# Patient Record
Sex: Male | Born: 1965
Health system: Southern US, Community
[De-identification: ages and names within clinical notes are randomized; demographics above are authoritative.]

## PROBLEM LIST (undated history)

## (undated) DIAGNOSIS — IMO0002 Reserved for concepts with insufficient information to code with codable children: Secondary | ICD-10-CM

## (undated) DIAGNOSIS — N400 Enlarged prostate without lower urinary tract symptoms: Secondary | ICD-10-CM

## (undated) DIAGNOSIS — R39198 Other difficulties with micturition: Secondary | ICD-10-CM

## (undated) DIAGNOSIS — Z860100 Personal history of colon polyps, unspecified: Secondary | ICD-10-CM

## (undated) DIAGNOSIS — J189 Pneumonia, unspecified organism: Secondary | ICD-10-CM

## (undated) DIAGNOSIS — Z8669 Personal history of other diseases of the nervous system and sense organs: Secondary | ICD-10-CM

## (undated) DIAGNOSIS — M069 Rheumatoid arthritis, unspecified: Secondary | ICD-10-CM

## (undated) DIAGNOSIS — G8929 Other chronic pain: Secondary | ICD-10-CM

## (undated) DIAGNOSIS — M62838 Other muscle spasm: Secondary | ICD-10-CM

## (undated) DIAGNOSIS — Z8709 Personal history of other diseases of the respiratory system: Secondary | ICD-10-CM

## (undated) DIAGNOSIS — F4310 Post-traumatic stress disorder, unspecified: Secondary | ICD-10-CM

## (undated) DIAGNOSIS — IMO0001 Reserved for inherently not codable concepts without codable children: Secondary | ICD-10-CM

## (undated) DIAGNOSIS — M2540 Effusion, unspecified joint: Secondary | ICD-10-CM

## (undated) DIAGNOSIS — F322 Major depressive disorder, single episode, severe without psychotic features: Secondary | ICD-10-CM

## (undated) DIAGNOSIS — E785 Hyperlipidemia, unspecified: Secondary | ICD-10-CM

## (undated) DIAGNOSIS — M1990 Unspecified osteoarthritis, unspecified site: Secondary | ICD-10-CM

## (undated) DIAGNOSIS — R569 Unspecified convulsions: Secondary | ICD-10-CM

## (undated) DIAGNOSIS — F419 Anxiety disorder, unspecified: Secondary | ICD-10-CM

## (undated) DIAGNOSIS — M549 Dorsalgia, unspecified: Secondary | ICD-10-CM

## (undated) DIAGNOSIS — R42 Dizziness and giddiness: Secondary | ICD-10-CM

## (undated) DIAGNOSIS — M2550 Pain in unspecified joint: Secondary | ICD-10-CM

## (undated) HISTORY — PX: COLONOSCOPY: SHX174

## (undated) HISTORY — PX: MULTIPLE TOOTH EXTRACTIONS: SHX2053

## (undated) HISTORY — PX: TONSILLECTOMY: SUR1361

---

## 2002-01-02 ENCOUNTER — Emergency Department (HOSPITAL_COMMUNITY): Admission: EM | Admit: 2002-01-02 | Discharge: 2002-01-03 | Payer: Self-pay | Admitting: Emergency Medicine

## 2002-01-03 ENCOUNTER — Encounter: Payer: Self-pay | Admitting: Emergency Medicine

## 2002-01-13 ENCOUNTER — Encounter: Payer: Self-pay | Admitting: Emergency Medicine

## 2002-01-13 ENCOUNTER — Emergency Department (HOSPITAL_COMMUNITY): Admission: EM | Admit: 2002-01-13 | Discharge: 2002-01-14 | Payer: Self-pay | Admitting: *Deleted

## 2002-04-09 ENCOUNTER — Emergency Department (HOSPITAL_COMMUNITY): Admission: EM | Admit: 2002-04-09 | Discharge: 2002-04-09 | Payer: Self-pay | Admitting: Internal Medicine

## 2002-09-20 ENCOUNTER — Emergency Department (HOSPITAL_COMMUNITY): Admission: EM | Admit: 2002-09-20 | Discharge: 2002-09-20 | Payer: Self-pay | Admitting: Emergency Medicine

## 2002-09-20 ENCOUNTER — Encounter: Payer: Self-pay | Admitting: Emergency Medicine

## 2003-02-01 ENCOUNTER — Emergency Department (HOSPITAL_COMMUNITY): Admission: EM | Admit: 2003-02-01 | Discharge: 2003-02-01 | Payer: Self-pay | Admitting: Emergency Medicine

## 2003-04-13 ENCOUNTER — Emergency Department (HOSPITAL_COMMUNITY): Admission: EM | Admit: 2003-04-13 | Discharge: 2003-04-13 | Payer: Self-pay | Admitting: *Deleted

## 2003-06-28 ENCOUNTER — Emergency Department (HOSPITAL_COMMUNITY): Admission: EM | Admit: 2003-06-28 | Discharge: 2003-06-28 | Payer: Self-pay | Admitting: Emergency Medicine

## 2003-06-28 ENCOUNTER — Encounter: Payer: Self-pay | Admitting: Emergency Medicine

## 2003-08-03 ENCOUNTER — Emergency Department (HOSPITAL_COMMUNITY): Admission: EM | Admit: 2003-08-03 | Discharge: 2003-08-03 | Payer: Self-pay | Admitting: Emergency Medicine

## 2003-08-03 ENCOUNTER — Encounter: Payer: Self-pay | Admitting: Emergency Medicine

## 2003-09-18 ENCOUNTER — Emergency Department (HOSPITAL_COMMUNITY): Admission: EM | Admit: 2003-09-18 | Discharge: 2003-09-18 | Payer: Self-pay | Admitting: Internal Medicine

## 2003-10-19 ENCOUNTER — Emergency Department (HOSPITAL_COMMUNITY): Admission: EM | Admit: 2003-10-19 | Discharge: 2003-10-19 | Payer: Self-pay | Admitting: Emergency Medicine

## 2004-12-24 ENCOUNTER — Emergency Department (HOSPITAL_COMMUNITY): Admission: EM | Admit: 2004-12-24 | Discharge: 2004-12-24 | Payer: Self-pay | Admitting: Emergency Medicine

## 2006-12-06 ENCOUNTER — Emergency Department (HOSPITAL_COMMUNITY): Admission: EM | Admit: 2006-12-06 | Discharge: 2006-12-06 | Payer: Self-pay | Admitting: Emergency Medicine

## 2008-07-04 ENCOUNTER — Emergency Department (HOSPITAL_COMMUNITY): Admission: EM | Admit: 2008-07-04 | Discharge: 2008-07-04 | Payer: Self-pay | Admitting: Emergency Medicine

## 2009-07-19 ENCOUNTER — Emergency Department (HOSPITAL_COMMUNITY): Admission: EM | Admit: 2009-07-19 | Discharge: 2009-07-19 | Payer: Self-pay | Admitting: Emergency Medicine

## 2010-01-17 ENCOUNTER — Emergency Department (HOSPITAL_COMMUNITY): Admission: EM | Admit: 2010-01-17 | Discharge: 2010-01-17 | Payer: Self-pay | Admitting: Emergency Medicine

## 2010-02-21 ENCOUNTER — Ambulatory Visit (HOSPITAL_COMMUNITY)
Admission: RE | Admit: 2010-02-21 | Discharge: 2010-02-21 | Payer: Self-pay | Source: Home / Self Care | Admitting: Physical Medicine and Rehabilitation

## 2010-11-12 ENCOUNTER — Emergency Department (HOSPITAL_COMMUNITY)
Admission: EM | Admit: 2010-11-12 | Discharge: 2010-11-12 | Payer: Self-pay | Source: Home / Self Care | Admitting: Emergency Medicine

## 2010-11-12 LAB — DIFFERENTIAL
Basophils Absolute: 0 10*3/uL (ref 0.0–0.1)
Basophils Relative: 0 % (ref 0–1)
Eosinophils Absolute: 0.1 10*3/uL (ref 0.0–0.7)
Eosinophils Relative: 2 % (ref 0–5)
Lymphocytes Relative: 30 % (ref 12–46)
Lymphs Abs: 1.4 10*3/uL (ref 0.7–4.0)
Monocytes Absolute: 0.4 10*3/uL (ref 0.1–1.0)
Monocytes Relative: 8 % (ref 3–12)
Neutro Abs: 2.9 10*3/uL (ref 1.7–7.7)
Neutrophils Relative %: 60 % (ref 43–77)

## 2010-11-12 LAB — BASIC METABOLIC PANEL
BUN: 6 mg/dL (ref 6–23)
CO2: 26 mEq/L (ref 19–32)
Calcium: 8.7 mg/dL (ref 8.4–10.5)
Chloride: 102 mEq/L (ref 96–112)
Creatinine, Ser: 0.73 mg/dL (ref 0.4–1.5)
GFR calc Af Amer: >60 mL/min (ref >60)
GFR calc non Af Amer: >60 mL/min (ref >60)
Glucose, Bld: 104 mg/dL — ABNORMAL HIGH (ref 70–99)
Potassium: 3.7 mEq/L (ref 3.5–5.1)
Sodium: 136 mEq/L (ref 135–145)

## 2010-11-12 LAB — CBC
HCT: 48.6 % (ref 39.0–52.0)
Hemoglobin: 17.5 g/dL — ABNORMAL HIGH (ref 13.0–17.0)
MCH: 32.7 pg (ref 26.0–34.0)
MCHC: 36 g/dL (ref 30.0–36.0)
MCV: 90.8 fL (ref 78.0–100.0)
Platelets: 137 10*3/uL — ABNORMAL LOW (ref 150–400)
RBC: 5.35 MIL/uL (ref 4.22–5.81)
RDW: 14.1 % (ref 11.5–15.5)
WBC: 4.8 10*3/uL (ref 4.0–10.5)

## 2010-11-12 LAB — PHENYTOIN LEVEL, TOTAL: Phenytoin Lvl: 14.6 ug/mL (ref 10.0–20.0)

## 2010-11-13 LAB — GLUCOSE, CAPILLARY: Glucose-Capillary: 104 mg/dL — ABNORMAL HIGH (ref 70–99)

## 2011-01-26 ENCOUNTER — Emergency Department (HOSPITAL_COMMUNITY): Payer: PRIVATE HEALTH INSURANCE

## 2011-01-26 ENCOUNTER — Emergency Department (HOSPITAL_COMMUNITY)
Admission: EM | Admit: 2011-01-26 | Discharge: 2011-01-26 | Disposition: A | Discharge status: Home / Self Care | Payer: PRIVATE HEALTH INSURANCE | Attending: Emergency Medicine | Admitting: Emergency Medicine

## 2011-01-26 DIAGNOSIS — R4182 Altered mental status, unspecified: Secondary | ICD-10-CM | POA: Insufficient documentation

## 2011-01-26 DIAGNOSIS — F4310 Post-traumatic stress disorder, unspecified: Secondary | ICD-10-CM | POA: Insufficient documentation

## 2011-01-26 DIAGNOSIS — R569 Unspecified convulsions: Secondary | ICD-10-CM | POA: Insufficient documentation

## 2011-01-26 LAB — CBC
HCT: 50 % (ref 39.0–52.0)
Hemoglobin: 17.4 g/dL — ABNORMAL HIGH (ref 13.0–17.0)
MCH: 32.8 pg (ref 26.0–34.0)
MCHC: 34.8 g/dL (ref 30.0–36.0)
MCV: 94.2 fL (ref 78.0–100.0)
Platelets: 130 10*3/uL — ABNORMAL LOW (ref 150–400)
RBC: 5.31 MIL/uL (ref 4.22–5.81)
RDW: 14.6 % (ref 11.5–15.5)
WBC: 4.3 10*3/uL (ref 4.0–10.5)

## 2011-01-26 LAB — DIFFERENTIAL
Basophils Absolute: 0 10*3/uL (ref 0.0–0.1)
Basophils Relative: 0 % (ref 0–1)
Eosinophils Absolute: 0 10*3/uL (ref 0.0–0.7)
Eosinophils Relative: 1 % (ref 0–5)
Lymphocytes Relative: 20 % (ref 12–46)
Lymphs Abs: 0.9 10*3/uL (ref 0.7–4.0)
Monocytes Absolute: 0.4 10*3/uL (ref 0.1–1.0)
Monocytes Relative: 9 % (ref 3–12)
Neutro Abs: 3.1 10*3/uL (ref 1.7–7.7)
Neutrophils Relative %: 71 % (ref 43–77)

## 2011-01-26 LAB — COMPREHENSIVE METABOLIC PANEL
ALT: 16 U/L (ref 0–53)
AST: 18 U/L (ref 0–37)
Albumin: 4.1 g/dL (ref 3.5–5.2)
Alkaline Phosphatase: 139 U/L — ABNORMAL HIGH (ref 39–117)
BUN: 5 mg/dL — ABNORMAL LOW (ref 6–23)
CO2: 25 mEq/L (ref 19–32)
Calcium: 9 mg/dL (ref 8.4–10.5)
Chloride: 103 mEq/L (ref 96–112)
Creatinine, Ser: 0.8 mg/dL (ref 0.4–1.5)
GFR calc Af Amer: >60 mL/min (ref >60)
GFR calc non Af Amer: >60 mL/min (ref >60)
Glucose, Bld: 95 mg/dL (ref 70–99)
Potassium: 3.5 mEq/L (ref 3.5–5.1)
Sodium: 137 mEq/L (ref 135–145)
Total Bilirubin: 0.7 mg/dL (ref 0.3–1.2)
Total Protein: 6.8 g/dL (ref 6.0–8.3)

## 2011-01-26 LAB — POCT CARDIAC MARKERS
CKMB, poc: <1 ng/mL — ABNORMAL LOW (ref 1.0–8.0)
Myoglobin, poc: 91.1 ng/mL (ref 12–200)
Troponin i, poc: <0.05 ng/mL (ref 0.00–0.09)

## 2011-01-26 LAB — GLUCOSE, CAPILLARY: Glucose-Capillary: 167 mg/dL — ABNORMAL HIGH (ref 70–99)

## 2011-01-26 LAB — RAPID URINE DRUG SCREEN, HOSP PERFORMED
Amphetamines: NOT DETECTED
Barbiturates: NOT DETECTED
Benzodiazepines: POSITIVE — AB
Cocaine: NOT DETECTED
Opiates: NOT DETECTED
Tetrahydrocannabinol: POSITIVE — AB

## 2011-01-26 LAB — ETHANOL: Alcohol, Ethyl (B): <5 mg/dL (ref 0–10)

## 2011-01-27 LAB — GLUCOSE, CAPILLARY: Glucose-Capillary: 144 mg/dL — ABNORMAL HIGH (ref 70–99)

## 2011-02-13 LAB — DIFFERENTIAL
Basophils Absolute: 0 K/uL (ref 0.0–0.1)
Basophils Relative: 1 % (ref 0–1)
Eosinophils Absolute: 0.1 K/uL (ref 0.0–0.7)
Eosinophils Relative: 2 % (ref 0–5)
Lymphocytes Relative: 28 % (ref 12–46)
Lymphs Abs: 1.8 K/uL (ref 0.7–4.0)
Monocytes Absolute: 0.5 K/uL (ref 0.1–1.0)
Monocytes Relative: 9 % (ref 3–12)
Neutro Abs: 3.9 K/uL (ref 1.7–7.7)
Neutrophils Relative %: 61 % (ref 43–77)

## 2011-02-13 LAB — CBC
HCT: 48.3 % (ref 39.0–52.0)
Hemoglobin: 17 g/dL (ref 13.0–17.0)
MCHC: 35.1 g/dL (ref 30.0–36.0)
MCV: 92.1 fL (ref 78.0–100.0)
Platelets: 198 K/uL (ref 150–400)
RBC: 5.24 MIL/uL (ref 4.22–5.81)
RDW: 13.7 % (ref 11.5–15.5)
WBC: 6.4 K/uL (ref 4.0–10.5)

## 2011-02-13 LAB — BASIC METABOLIC PANEL WITH GFR
BUN: 6 mg/dL (ref 6–23)
CO2: 34 meq/L — ABNORMAL HIGH (ref 19–32)
Calcium: 9.3 mg/dL (ref 8.4–10.5)
Chloride: 106 meq/L (ref 96–112)
Creatinine, Ser: 1.04 mg/dL (ref 0.4–1.5)
GFR calc non Af Amer: >60 mL/min
Glucose, Bld: 85 mg/dL (ref 70–99)
Potassium: 4.7 meq/L (ref 3.5–5.1)
Sodium: 142 meq/L (ref 135–145)

## 2011-06-23 ENCOUNTER — Other Ambulatory Visit: Payer: Self-pay

## 2011-06-23 ENCOUNTER — Emergency Department (HOSPITAL_COMMUNITY)
Admission: EM | Admit: 2011-06-23 | Discharge: 2011-06-24 | Disposition: A | Discharge status: Other Acute Inpatient Hospital | Payer: PRIVATE HEALTH INSURANCE | Attending: Emergency Medicine | Admitting: Emergency Medicine

## 2011-06-23 ENCOUNTER — Encounter: Payer: Self-pay | Admitting: Emergency Medicine

## 2011-06-23 DIAGNOSIS — F17200 Nicotine dependence, unspecified, uncomplicated: Secondary | ICD-10-CM | POA: Insufficient documentation

## 2011-06-23 DIAGNOSIS — F29 Unspecified psychosis not due to a substance or known physiological condition: Secondary | ICD-10-CM | POA: Insufficient documentation

## 2011-06-23 DIAGNOSIS — R569 Unspecified convulsions: Secondary | ICD-10-CM | POA: Insufficient documentation

## 2011-06-23 HISTORY — DX: Unspecified convulsions: R56.9

## 2011-06-23 LAB — RAPID URINE DRUG SCREEN, HOSP PERFORMED
Amphetamines: NOT DETECTED
Benzodiazepines: POSITIVE — AB
Tetrahydrocannabinol: POSITIVE — AB

## 2011-06-23 LAB — BASIC METABOLIC PANEL
CO2: 23 mEq/L (ref 19–32)
Calcium: 10.3 mg/dL (ref 8.4–10.5)
Chloride: 99 mEq/L (ref 96–112)
Creatinine, Ser: 0.88 mg/dL (ref 0.50–1.35)
Glucose, Bld: 93 mg/dL (ref 70–99)

## 2011-06-23 LAB — CBC
HCT: 52 % (ref 39.0–52.0)
Hemoglobin: 18.1 g/dL — ABNORMAL HIGH (ref 13.0–17.0)
MCV: 92.4 fL (ref 78.0–100.0)
RBC: 5.63 MIL/uL (ref 4.22–5.81)
WBC: 17 10*3/uL — ABNORMAL HIGH (ref 4.0–10.5)

## 2011-06-23 LAB — DIFFERENTIAL
Eosinophils Relative: 0 % (ref 0–5)
Lymphocytes Relative: 7 % — ABNORMAL LOW (ref 12–46)
Lymphs Abs: 1.2 10*3/uL (ref 0.7–4.0)
Monocytes Absolute: 1.4 10*3/uL — ABNORMAL HIGH (ref 0.1–1.0)
Monocytes Relative: 8 % (ref 3–12)
Neutro Abs: 14.4 10*3/uL — ABNORMAL HIGH (ref 1.7–7.7)

## 2011-06-23 MED ORDER — ACETAMINOPHEN 325 MG PO TABS: 650.0000 mg | ORAL_TABLET | ORAL | Status: DC | PRN

## 2011-06-23 MED ORDER — IBUPROFEN 400 MG PO TABS: 600.0000 mg | ORAL_TABLET | Freq: Three times a day (TID) | ORAL | Status: DC | PRN

## 2011-06-23 MED ORDER — ONDANSETRON HCL 4 MG PO TABS: 4.0000 mg | ORAL_TABLET | Freq: Three times a day (TID) | ORAL | Status: DC | PRN

## 2011-06-23 MED ORDER — ALUM & MAG HYDROXIDE-SIMETH 200-200-20 MG/5ML PO SUSP: 30.0000 mL | ORAL | Status: DC | PRN

## 2011-06-23 MED ORDER — SODIUM CHLORIDE 0.9 % IV BOLUS (SEPSIS)
1000.0000 mL | Freq: Once | INTRAVENOUS | Status: AC
  Administered 2011-06-23: 1000 mL via INTRAVENOUS

## 2011-06-23 MED ORDER — NICOTINE 21 MG/24HR TD PT24: 21.0000 mg | MEDICATED_PATCH | Freq: Every day | TRANSDERMAL | Status: DC

## 2011-06-23 MED ORDER — ZOLPIDEM TARTRATE 5 MG PO TABS: 10.0000 mg | ORAL_TABLET | Freq: Every evening | ORAL | Status: DC | PRN

## 2011-06-23 MED ORDER — LORAZEPAM 1 MG PO TABS: 2.0000 mg | ORAL_TABLET | ORAL | Status: DC | PRN

## 2011-06-23 NOTE — ED Notes (Signed)
Pt alert and oriented, although difficult to redirect at times.  Reports that he was mowing his yard, and then remembers being told he had a seizure.  Pt does admit to not taking his medications for the last few days.  Currently, no distress or seizure activity noted.  Saline lock in place.  Access flushed, site benign.  EDP at bedside.

## 2011-06-23 NOTE — ED Notes (Signed)
[employer] on unit to serve involuntary commitment papers.  Pt cooperative and understanding of situation.

## 2011-06-23 NOTE — ED Provider Notes (Addendum)
History  Scribed for Dr. Adriana Simas, the patient was seen in room 14. This chart was scribed by Hillery Hunter. This patient's care was started at 7:32PM.   CSN: [id number] Arrival date & time: 06/23/2011  6:55 PM  Chief Complaint  Patient presents with  . Seizures   HPI Jeffrey Wilcox is a 45 y.o. male with a known sz disorder presents to the Emergency Department complaining of a single episode of seizure at home today. The patient reports that he had stopped taking his medications (including Lexapro, Lamictal, Dilantin and a benzodiazepine) 3 or 4 days ago because he, "wanted to start from scratch" - including stopping smoking. He reports that he was outdoors in the sun today for several hours mowing the lawn with a push mower just prior to onset.   The patient was brought in by a home care nurse who did not witness the seizure.  The patient reports that his last seizure may have been "one time within the year".   Additional history provided by sister as reported over the phone to nurse: She reports that patient "took a bunch of medications" and "impaled himself" earlier today.   HPI ELEMENTS:  Onset: today Timing: single episode Modifying factors: stopped taking medications Context: seizure occurred while mowing the lawn    Past Medical History  Diagnosis Date  . Seizures     Past Surgical History  Procedure Date  . Tonsillectomy     No family history on file.  History  Substance Use Topics  . Smoking status: Current Everyday Smoker -- 1.0 packs/day  . Smokeless tobacco: Not on file  . Alcohol Use: No      Review of Systems  Constitutional: Negative for fever.  All other systems reviewed and are negative.    Physical Exam  BP 121/89  Pulse 100  Temp(Src) 97.5 F (36.4 C) (Oral)  Resp 16  Ht 6' (1.829 m)  Wt 190 lb (86.183 kg)  BMI 25.77 kg/m2  SpO2 92%  Physical Exam  Nursing note and vitals reviewed. Constitutional: He is oriented to person,  place, and time. He appears well-developed and well-nourished. No distress.  HENT:  Head: Normocephalic and atraumatic.  Neck: Neck supple.  Cardiovascular: Normal rate, regular rhythm and normal heart sounds.   Pulmonary/Chest: Effort normal and breath sounds normal.  Musculoskeletal: Normal range of motion.  Neurological: He is alert and oriented to person, place, and time.       Able to move arms around  Skin: Skin is warm and dry.       Small spot of dried blood on center abdomen above umbilicus  Psychiatric:       Loose associations Flighty and inappropriate affect    ED Course  Procedures  OTHER DATA REVIEWED: Nursing notes, vital signs, and past medical records reviewed.   DIAGNOSTIC STUDIES: Oxygen Saturation is 92% on room air, adequate by my interpretation.    LABS / RADIOLOGY:  Results for orders placed during the hospital encounter of 06/23/11  CBC      Component Value Range   WBC 17.0 (*) 4.0 - 10.5 (K/uL)   RBC 5.63  4.22 - 5.81 (MIL/uL)   Hemoglobin 18.1 (*) 13.0 - 17.0 (g/dL)   HCT 29.5  28.4 - 13.2 (%)   MCV 92.4  78.0 - 100.0 (fL)   MCH 32.1  26.0 - 34.0 (pg)   MCHC 34.8  30.0 - 36.0 (g/dL)   RDW 44.0  10.2 - 72.5 (%)  Platelets 152  150 - 400 (K/uL)  DIFFERENTIAL      Component Value Range   Neutrophils Relative 85 (*) 43 - 77 (%)   Neutro Abs 14.4 (*) 1.7 - 7.7 (K/uL)   Lymphocytes Relative 7 (*) 12 - 46 (%)   Lymphs Abs 1.2  0.7 - 4.0 (K/uL)   Monocytes Relative 8  3 - 12 (%)   Monocytes Absolute 1.4 (*) 0.1 - 1.0 (K/uL)   Eosinophils Relative 0  0 - 5 (%)   Eosinophils Absolute 0.0  0.0 - 0.7 (K/uL)   Basophils Relative 0  0 - 1 (%)   Basophils Absolute 0.0  0.0 - 0.1 (K/uL)  BASIC METABOLIC PANEL      Component Value Range   Sodium 140  135 - 145 (mEq/L)   Potassium 3.5  3.5 - 5.1 (mEq/L)   Chloride 99  96 - 112 (mEq/L)   CO2 23  19 - 32 (mEq/L)   Glucose, Bld 93  70 - 99 (mg/dL)   BUN 25 (*) 6 - 23 (mg/dL)   Creatinine, Ser 1.61   0.50 - 1.35 (mg/dL)   Calcium 09.6  8.4 - 10.5 (mg/dL)   GFR calc non Af Amer >60  >60 (mL/min)   GFR calc Af Amer >60  >60 (mL/min)  ETHANOL      Component Value Range   Alcohol, Ethyl (B) <11  0 - 11 (mg/dL)  URINE RAPID DRUG SCREEN (HOSP PERFORMED)      Component Value Range   Opiates NONE DETECTED  NONE DETECTED    Cocaine NONE DETECTED  NONE DETECTED    Benzodiazepines POSITIVE (*) NONE DETECTED    Amphetamines NONE DETECTED  NONE DETECTED    Tetrahydrocannabinol POSITIVE (*) NONE DETECTED    Barbiturates NONE DETECTED  NONE DETECTED      ED COURSE / COORDINATION OF CARE: IV NS Fluids were ordered for dehydration Patient was recommended to resume taking prescribed seizure medication, and continue to not smoke   MDM:     Production assistant, radio I personally performed the services described in this documentation, which was scribed in my presence. The recorded information has been reviewed and considered. Donnetta Hutching, MD    Donnetta Hutching, MD 06/23/11 2319  After reviewing the patient's prior emergency department visits, I confirmed that the patient is taking Dilantin for a seizure disorder. His Dilantin level returns at 0.9 which is quite low. I have ordered 3 doses of Dilantin 300 mg 2 hours apart in order to orally load him with Dilantin.  Felisa Bonier, MD 06/24/11 [phone]

## 2011-06-23 NOTE — ED Provider Notes (Addendum)
History     CSN: [id number] Arrival date & time: 06/23/2011  6:55 PM  Chief Complaint  Patient presents with  . Seizures   HPI  Past Medical History  Diagnosis Date  . Seizures     Past Surgical History  Procedure Date  . Tonsillectomy     No family history on file.  History  Substance Use Topics  . Smoking status: Current Everyday Smoker -- 1.0 packs/day  . Smokeless tobacco: Not on file  . Alcohol Use: No      Review of Systems  Physical Exam  BP 121/89  Pulse 100  Temp(Src) 97.5 F (36.4 C) (Oral)  Resp 16  Ht 6' (1.829 m)  Wt 190 lb (86.183 kg)  BMI 25.77 kg/m2  SpO2 92%  Physical Exam  ED Course  Procedures  MDM Patient allegedly had a seizure while mowing grass today. He has not been taking his antiseizure medicines see history of present illness for further history. Additionally during hospital course, the nurse showed me involuntary commitment papers signed by his sister Bradly Chris a Su Hilt. She states patient has a drug problem. His old medications to get money for street drugs. His suicidal and tried to impaling himself today on a sharp object. She states he is a danger to himself. I discussed the situation with the behavioral health consultant, Samson Frederic, and she will evaluate the patient. Patient has had no evidence of seizures in the emergency department. He was lucid but has flight of ideas and tangential thinking.  I personally performed the services described in this documentation, which was scribed in my presence. The recorded information has been reviewed and considered. No att. providers found   Date: 06/23/2011  Rate: 99  Rhythm: normal sinus rhythm  QRS Axis: normal  Intervals: normal  ST/T Wave abnormalities: normal  Conduction Disutrbances:none  Narrative Interpretation:   Old EKG Reviewed: none available Critical care 30 min:  Disc c pt and consultant, commitment papers, eval of seizure and psychiatric illness, unstable Donnetta Hutching,  MD 06/23/11 [id number]  Donnetta Hutching, MD 06/26/11 1347

## 2011-06-23 NOTE — ED Notes (Signed)
Pt alert and oriented very diaphoretic no post dictal behaviors noted. Pt stated he quit tasking his seizure meds 5 days ago by choice witnessed seizure for 2-3 minutes

## 2011-06-24 LAB — PHENYTOIN LEVEL, TOTAL: Phenytoin Lvl: 0.9 ug/mL — ABNORMAL LOW (ref 10.0–20.0)

## 2011-06-24 MED ORDER — LORAZEPAM 2 MG/ML IJ SOLN
2.0000 mg | Freq: Once | INTRAMUSCULAR | Status: AC
  Administered 2011-06-24: 2 mg via INTRAVENOUS
  Filled 2011-06-24: qty 1

## 2011-06-24 MED ORDER — PHENYTOIN SODIUM EXTENDED 100 MG PO CAPS
300.0000 mg | ORAL_CAPSULE | Freq: Once | ORAL | Status: AC
  Administered 2011-06-24: 300 mg via ORAL
  Filled 2011-06-24: qty 3

## 2011-06-24 MED ORDER — LAMOTRIGINE 200 MG PO TABS
200.0000 mg | ORAL_TABLET | Freq: Every day | ORAL | Status: DC
  Filled 2011-06-24: qty 1

## 2011-06-24 MED ORDER — CITALOPRAM HYDROBROMIDE 20 MG PO TABS: 20.0000 mg | ORAL_TABLET | Freq: Every day | ORAL | Status: DC

## 2011-06-24 MED ORDER — CLONAZEPAM 0.5 MG PO TABS: 3.0000 mg | ORAL_TABLET | Freq: Every evening | ORAL | Status: DC | PRN

## 2011-06-24 MED ORDER — ESCITALOPRAM OXALATE 10 MG PO TABS
20.0000 mg | ORAL_TABLET | Freq: Every day | ORAL | Status: DC
  Administered 2011-06-24: 20 mg via ORAL
  Filled 2011-06-24: qty 2

## 2011-06-24 MED ORDER — PHENYTOIN SODIUM EXTENDED 100 MG PO CAPS: 300.0000 mg | ORAL_CAPSULE | Freq: Every day | ORAL | Status: DC

## 2011-06-24 MED ORDER — CLONAZEPAM 0.5 MG PO TABS
1.0000 mg | ORAL_TABLET | Freq: Two times a day (BID) | ORAL | Status: DC | PRN
  Administered 2011-06-24 (×2): 1 mg via ORAL
  Filled 2011-06-24 (×2): qty 2

## 2011-06-24 MED ORDER — PHENYTOIN SODIUM EXTENDED 100 MG PO CAPS: 300.0000 mg | ORAL_CAPSULE | Freq: Three times a day (TID) | ORAL | Status: DC

## 2011-06-24 NOTE — ED Notes (Signed)
[facility] called for transport to [facility].

## 2011-06-24 NOTE — ED Notes (Signed)
Pt given breakfast tray. Ate 100%

## 2011-06-24 NOTE — ED Notes (Signed)
Called [employer] for update on transfer of Pt.  Officer had been diverted to another call, but will be here as soon as possible.  Nurse informed.

## 2011-06-24 NOTE — ED Notes (Signed)
Waiting for pharmacy to restock pyxis to give Dilantin ER 300 mg PO.

## 2011-06-24 NOTE — ED Notes (Signed)
See downtime flow sheet.

## 2011-06-24 NOTE — Progress Notes (Signed)
Pt accepted to [facility] will transfer.

## 2011-09-15 ENCOUNTER — Emergency Department: Admit: 2011-09-15 | Payer: PRIVATE HEALTH INSURANCE

## 2011-09-15 ENCOUNTER — Emergency Department (HOSPITAL_COMMUNITY): Payer: PRIVATE HEALTH INSURANCE

## 2011-09-15 ENCOUNTER — Encounter (HOSPITAL_COMMUNITY): Payer: Self-pay | Admitting: Emergency Medicine

## 2011-09-15 ENCOUNTER — Emergency Department (HOSPITAL_COMMUNITY)
Admission: EM | Admit: 2011-09-15 | Discharge: 2011-09-15 | Disposition: A | Discharge status: Home / Self Care | Payer: PRIVATE HEALTH INSURANCE | Attending: Emergency Medicine | Admitting: Emergency Medicine

## 2011-09-15 DIAGNOSIS — IMO0002 Reserved for concepts with insufficient information to code with codable children: Secondary | ICD-10-CM | POA: Insufficient documentation

## 2011-09-15 DIAGNOSIS — F329 Major depressive disorder, single episode, unspecified: Secondary | ICD-10-CM | POA: Insufficient documentation

## 2011-09-15 DIAGNOSIS — R569 Unspecified convulsions: Secondary | ICD-10-CM | POA: Insufficient documentation

## 2011-09-15 DIAGNOSIS — F17200 Nicotine dependence, unspecified, uncomplicated: Secondary | ICD-10-CM | POA: Insufficient documentation

## 2011-09-15 DIAGNOSIS — N509 Disorder of male genital organs, unspecified: Secondary | ICD-10-CM | POA: Insufficient documentation

## 2011-09-15 DIAGNOSIS — F3289 Other specified depressive episodes: Secondary | ICD-10-CM | POA: Insufficient documentation

## 2011-09-15 DIAGNOSIS — M069 Rheumatoid arthritis, unspecified: Secondary | ICD-10-CM | POA: Insufficient documentation

## 2011-09-15 DIAGNOSIS — N50819 Testicular pain, unspecified: Secondary | ICD-10-CM

## 2011-09-15 HISTORY — DX: Major depressive disorder, single episode, severe without psychotic features: F32.2

## 2011-09-15 HISTORY — DX: Reserved for concepts with insufficient information to code with codable children: IMO0002

## 2011-09-15 HISTORY — DX: Rheumatoid arthritis, unspecified: M06.9

## 2011-09-15 LAB — URINALYSIS, ROUTINE W REFLEX MICROSCOPIC
Glucose, UA: NEGATIVE mg/dL
Hgb urine dipstick: NEGATIVE
Leukocytes, UA: NEGATIVE
Specific Gravity, Urine: 1.02 (ref 1.005–1.030)
Urobilinogen, UA: 0.2 mg/dL (ref 0.0–1.0)

## 2011-09-15 MED ORDER — DOXYCYCLINE HYCLATE 100 MG PO CAPS: 100.0000 mg | ORAL_CAPSULE | Freq: Two times a day (BID) | ORAL | Status: AC

## 2011-09-15 MED ORDER — HYDROMORPHONE HCL PF 1 MG/ML IJ SOLN
1.0000 mg | Freq: Once | INTRAMUSCULAR | Status: AC
  Administered 2011-09-15: 1 mg via INTRAVENOUS
  Filled 2011-09-15: qty 1

## 2011-09-15 MED ORDER — OXYCODONE-ACETAMINOPHEN 5-325 MG PO TABS: 1.0000 | ORAL_TABLET | ORAL | Status: AC | PRN

## 2011-09-15 NOTE — ED Provider Notes (Signed)
History     CSN: [id number] Arrival date & time: 09/15/2011  6:47 PM   First MD Initiated Contact with Patient 09/15/11 1853    pt seen at 1948  Chief Complaint  Patient presents with  . Testicle Pain  . Groin Swelling    Patient is a 45 y.o. male presenting with testicular pain. The history is provided by the patient.  Testicle Pain This is a new problem. The current episode started more than 2 days ago. The problem occurs constantly. The problem has been gradually worsening. Pertinent negatives include no abdominal pain. Exacerbated by: palpation.  pt reports onset of testicular pain several days ago, gradual onset No trauma Never had this before No recent sexual activity No abd pain/back pain +dysuria, +hematuria Reports recent nocturia No focal weakness in his lower extremities.   Past Medical History  Diagnosis Date  . Seizures   . Degenerative disc disease   . Severe depression   . Arthritis, rheumatoid     Past Surgical History  Procedure Date  . Tonsillectomy     Family History  Problem Relation Age of Onset  . Adopted: Yes    History  Substance Use Topics  . Smoking status: Current Everyday Smoker -- 0.5 packs/day for 30 years    Types: Cigarettes  . Smokeless tobacco: Former Neurosurgeon  . Alcohol Use: No      Review of Systems  Gastrointestinal: Negative for abdominal pain.  Genitourinary: Positive for testicular pain.  All other systems reviewed and are negative.    Allergies  Penicillins and Other  Home Medications   Current Outpatient Rx  Name Route Sig Dispense Refill  . ESCITALOPRAM OXALATE 20 MG PO TABS Oral Take 20 mg by mouth every morning.     Marland Kitchen HYDROCODONE-ACETAMINOPHEN 7.5-325 MG PO TABS Oral Take 2 tablets by mouth once as needed. For pain     . IBUPROFEN 800 MG PO TABS Oral Take 800 mg by mouth 2 (two) times daily as needed. For pain     . LAMOTRIGINE 200 MG PO TB24 Oral Take 1 tablet by mouth at bedtime.      Marland Kitchen PHENYTOIN SODIUM  EXTENDED 100 MG PO CAPS Oral Take 100 mg by mouth 2 (two) times daily.      Marland Kitchen CLONAZEPAM 1 MG PO TABS Oral Take 1 mg by mouth 2 (two) times daily as needed. And may take 3 tablets at bedtime    . LAMOTRIGINE 100 MG PO TABS Oral Take 100 mg by mouth at bedtime.     Marland Kitchen MENTHOL (TOPICAL ANALGESIC) 7.5 % (ROLL) EX MISC Apply externally Apply 1 each topically as needed. For muscle aches and pain       BP 123/78  Pulse 70  Temp(Src) 97.3 F (36.3 C) (Oral)  Resp 18  Ht 5\' 11"  (1.803 m)  Wt 189 lb (85.73 kg)  BMI 26.36 kg/m2  SpO2 98%  Physical Exam  CONSTITUTIONAL: Well developed/well nourished, uncomfortable appearing HEAD AND FACE: Normocephalic/atraumatic EYES: EOMI/PERRL ENMT: Mucous membranes moist NECK: supple no meningeal signs CV: S1/S2 noted, no murmurs/rubs/gallops noted LUNGS: Lungs are clear to auscultation bilaterally, no apparent distress ABDOMEN: soft, nontender, no rebound or guarding GU:no cva tenderness, bilateral testicular tenderness but no edema/erythema/crepitance noted, no hernia noted Chaperone present during exam RECTAL - prostate tender, no abscess noted, stool color normal.  Chaperone present NEURO: Pt is awake/alert, moves all extremitiesx4, no focal motor deficits are noted in the LE EXTREMITIES: pulses normal, full ROM SKIN:  warm, color normal PSYCH: anxious   ED Course  Procedures    Labs Reviewed  URINALYSIS, ROUTINE W REFLEX MICROSCOPIC   7:06 PM Will get emergent [place] to evaluate his testicular pain.  Will tx with pain meds  8:51 PM Pt improved No new complaints Give nocturia/tender prostate, will start on doxycycline Referred him to urologist Stable for d/c  MDM  Nursing notes reviewed and considered in documentation All labs/vitals reviewed and considered         Joya Gaskins, MD 09/15/11 2052

## 2011-09-15 NOTE — ED Notes (Signed)
Patient c/o testicle pain with swelling. Patient c/o pain and blood with urination.

## 2011-09-15 NOTE — ED Notes (Signed)
X-ray [place] in [place]

## 2011-09-15 NOTE — ED Notes (Signed)
Pt states he cannot void at present

## 2011-09-27 ENCOUNTER — Other Ambulatory Visit: Payer: PRIVATE HEALTH INSURANCE

## 2011-09-30 ENCOUNTER — Other Ambulatory Visit: Payer: PRIVATE HEALTH INSURANCE

## 2011-10-05 ENCOUNTER — Ambulatory Visit: Admission: RE | Admit: 2011-10-05 | Payer: PRIVATE HEALTH INSURANCE | Source: Ambulatory Visit

## 2011-10-07 ENCOUNTER — Ambulatory Visit (HOSPITAL_COMMUNITY): Admission: RE | Admit: 2011-10-07 | Payer: PRIVATE HEALTH INSURANCE | Source: Ambulatory Visit

## 2011-11-05 ENCOUNTER — Emergency Department (HOSPITAL_COMMUNITY)
Admission: EM | Admit: 2011-11-05 | Discharge: 2011-11-05 | Disposition: A | Discharge status: Home / Self Care | Payer: PRIVATE HEALTH INSURANCE | Attending: Emergency Medicine | Admitting: Emergency Medicine

## 2011-11-05 ENCOUNTER — Emergency Department (HOSPITAL_COMMUNITY): Payer: PRIVATE HEALTH INSURANCE

## 2011-11-05 ENCOUNTER — Encounter (HOSPITAL_COMMUNITY): Payer: Self-pay

## 2011-11-05 DIAGNOSIS — Y92009 Unspecified place in unspecified non-institutional (private) residence as the place of occurrence of the external cause: Secondary | ICD-10-CM | POA: Insufficient documentation

## 2011-11-05 DIAGNOSIS — F3289 Other specified depressive episodes: Secondary | ICD-10-CM | POA: Insufficient documentation

## 2011-11-05 DIAGNOSIS — M549 Dorsalgia, unspecified: Secondary | ICD-10-CM | POA: Insufficient documentation

## 2011-11-05 DIAGNOSIS — M542 Cervicalgia: Secondary | ICD-10-CM | POA: Insufficient documentation

## 2011-11-05 DIAGNOSIS — W19XXXA Unspecified fall, initial encounter: Secondary | ICD-10-CM | POA: Insufficient documentation

## 2011-11-05 DIAGNOSIS — F17200 Nicotine dependence, unspecified, uncomplicated: Secondary | ICD-10-CM | POA: Insufficient documentation

## 2011-11-05 DIAGNOSIS — R569 Unspecified convulsions: Secondary | ICD-10-CM

## 2011-11-05 DIAGNOSIS — R51 Headache: Secondary | ICD-10-CM | POA: Insufficient documentation

## 2011-11-05 DIAGNOSIS — IMO0002 Reserved for concepts with insufficient information to code with codable children: Secondary | ICD-10-CM | POA: Insufficient documentation

## 2011-11-05 DIAGNOSIS — F29 Unspecified psychosis not due to a substance or known physiological condition: Secondary | ICD-10-CM | POA: Insufficient documentation

## 2011-11-05 DIAGNOSIS — R32 Unspecified urinary incontinence: Secondary | ICD-10-CM | POA: Insufficient documentation

## 2011-11-05 DIAGNOSIS — F329 Major depressive disorder, single episode, unspecified: Secondary | ICD-10-CM | POA: Insufficient documentation

## 2011-11-05 DIAGNOSIS — M069 Rheumatoid arthritis, unspecified: Secondary | ICD-10-CM | POA: Insufficient documentation

## 2011-11-05 LAB — PHENYTOIN LEVEL, TOTAL: Phenytoin Lvl: 13.2 ug/mL (ref 10.0–20.0)

## 2011-11-05 MED ORDER — LORAZEPAM 1 MG PO TABS
1.0000 mg | ORAL_TABLET | Freq: Once | ORAL | Status: AC
  Administered 2011-11-05: 1 mg via ORAL
  Filled 2011-11-05: qty 1

## 2011-11-05 MED ORDER — HYDROMORPHONE HCL PF 1 MG/ML IJ SOLN
1.0000 mg | Freq: Once | INTRAMUSCULAR | Status: AC
  Administered 2011-11-05: 1 mg via INTRAVENOUS
  Filled 2011-11-05: qty 1

## 2011-11-05 NOTE — ED Notes (Signed)
Pt states had a witnessed seizure @ 1130/1145. Pt was brought to Ed per POV. Pt reports no injury during seizure.

## 2011-11-05 NOTE — ED Provider Notes (Signed)
History     CSN: [id number]  Arrival date & time 11/05/11  1229   First MD Initiated Contact with Patient 11/05/11 1320      Chief Complaint  Patient presents with  . Seizures     Patient is a 45 y.o. male presenting with seizures. The history is provided by the patient and a relative.  Seizures  This is a recurrent problem. The current episode started 3 to 5 hours ago. The problem has been rapidly improving. There was 1 seizure. The most recent episode lasted 30 to 120 seconds. Associated symptoms include confusion. The episode was witnessed. The seizure(s) had no focality. There has been no fever.  SEVERITY - MODERATE  pt with h/o seizure Taking his anti-seizure meds as prescribed, no recent change Reports he was washing dishes and had a seizure Stopped on its own Had postictal confusion and urinary incontinence (typical for him) Now back to baseline Had "mini" seizure last week but did not get evaluated No recent fever or illnesses Now has headache, neck pain, back pain from fall No cp/sob/focal weakness reported  Past Medical History  Diagnosis Date  . Seizures   . Degenerative disc disease   . Severe depression   . Arthritis, rheumatoid   . Degenerative disc disease     Past Surgical History  Procedure Date  . Tonsillectomy     Family History  Problem Relation Age of Onset  . Adopted: Yes    History  Substance Use Topics  . Smoking status: Current Everyday Smoker -- 0.5 packs/day for 30 years    Types: Cigarettes  . Smokeless tobacco: Former Neurosurgeon  . Alcohol Use: No      Review of Systems  Neurological: Positive for seizures.  Psychiatric/Behavioral: Positive for confusion.  All other systems reviewed and are negative.    Allergies  Penicillins and Other  Home Medications   Current Outpatient Rx  Name Route Sig Dispense Refill  . CARBAMIDE PEROXIDE 6.5 % OT SOLN Otic Place 5 drops in ear(s) every 14 (fourteen) days.      Marland Kitchen CLONAZEPAM 1 MG  PO TABS Oral Take 1 mg by mouth 3 (three) times daily as needed. 1mg  twice a day then 3 mg at bedtime    . ESCITALOPRAM OXALATE 20 MG PO TABS Oral Take 20 mg by mouth every morning.     . IBUPROFEN 800 MG PO TABS Oral Take 800 mg by mouth 2 (two) times daily as needed. For pain     . LAMOTRIGINE ER 200 MG PO TB24 Oral Take 1 tablet by mouth at bedtime.      Marland Kitchen MENTHOL (TOPICAL ANALGESIC) 7.5 % (ROLL) EX MISC Apply externally Apply 1 each topically as needed. For muscle aches and pain     . PHENYTOIN SODIUM EXTENDED 300 MG PO CAPS Oral Take 300 mg by mouth 2 (two) times daily.        BP 124/81  Pulse 71  Temp(Src) 97.9 F (36.6 C) (Oral)  Resp 18  Ht 5\' 11"  (1.803 m)  Wt 195 lb (88.451 kg)  BMI 27.20 kg/m2  SpO2 97%  Physical Exam  CONSTITUTIONAL: Well developed/well nourished HEAD AND FACE: Normocephalic/atraumatic EYES: EOMI/PERRL ENMT: Mucous membranes moist NECK: supple no meningeal signs SPINE:C-spine nontender, +T/L tenderness but no bruising/stepoffs NEXUS criteria met CV: S1/S2 noted, no murmurs/rubs/gallops noted LUNGS: Lungs are clear to auscultation bilaterally, no apparent distress ABDOMEN: soft, nontender, no rebound or guarding GU:no cva tenderness NEURO: Pt is awake/alert, moves  all extremitiesx4 GCS 15 No focal motor deficits EXTREMITIES: pulses normal, full ROM, no tenderness SKIN: warm, color normal PSYCH: no abnormalities of mood noted    ED Course  Procedures    Labs Reviewed  PHENYTOIN LEVEL, TOTAL   2:06 PM Ct head not warranted as back to baseline Back to baseline Will obtain T/L imaging Also check PTN level  3:33 PM Pt seizure free Continue meds and he has f/u with neurologist already planned for january   MDM  Nursing notes reviewed and considered in documentation xrays reviewed and considered All labs/vitals reviewed and considered Previous records reviewed and considered         Joya Gaskins, MD 11/05/11 1534

## 2011-11-05 NOTE — ED Notes (Signed)
Pt reports was washing dishes and woke up on the floor with family standing over him.  Reports family states he had a seizure.  PT reports history of seizures.  Pt reports hit head on hard wood floor.  C/O pain to head, neck, and lower back.

## 2011-11-05 NOTE — ED Notes (Signed)
Dr Santo Held with pt. Pt not in room when discharge papers received.

## 2011-11-13 ENCOUNTER — Ambulatory Visit (INDEPENDENT_AMBULATORY_CARE_PROVIDER_SITE_OTHER): Payer: PRIVATE HEALTH INSURANCE | Admitting: Urology

## 2011-11-13 DIAGNOSIS — R109 Unspecified abdominal pain: Secondary | ICD-10-CM

## 2011-11-13 DIAGNOSIS — N509 Disorder of male genital organs, unspecified: Secondary | ICD-10-CM

## 2011-11-13 DIAGNOSIS — R39198 Other difficulties with micturition: Secondary | ICD-10-CM

## 2011-11-13 DIAGNOSIS — R3915 Urgency of urination: Secondary | ICD-10-CM

## 2011-11-17 ENCOUNTER — Other Ambulatory Visit: Payer: Self-pay | Admitting: Urology

## 2011-11-17 DIAGNOSIS — R1031 Right lower quadrant pain: Secondary | ICD-10-CM

## 2011-12-14 ENCOUNTER — Ambulatory Visit (HOSPITAL_COMMUNITY)
Admission: RE | Admit: 2011-12-14 | Discharge: 2011-12-14 | Disposition: A | Discharge status: Home / Self Care | Payer: PRIVATE HEALTH INSURANCE | Source: Ambulatory Visit | Attending: Urology | Admitting: Urology

## 2011-12-14 DIAGNOSIS — R1031 Right lower quadrant pain: Secondary | ICD-10-CM | POA: Insufficient documentation

## 2011-12-14 DIAGNOSIS — K4020 Bilateral inguinal hernia, without obstruction or gangrene, not specified as recurrent: Secondary | ICD-10-CM | POA: Insufficient documentation

## 2011-12-14 DIAGNOSIS — K5730 Diverticulosis of large intestine without perforation or abscess without bleeding: Secondary | ICD-10-CM | POA: Insufficient documentation

## 2011-12-31 ENCOUNTER — Encounter (HOSPITAL_COMMUNITY): Payer: Self-pay | Admitting: Pharmacy Technician

## 2011-12-31 NOTE — H&P (Signed)
  NTS SOAP Note  Vital Signs:  Vitals as of: 12/31/2011: Systolic 45 Diastolic 103: Heart Rate 104: Temp 96.87F: Height 66ft 11in: Weight 194Lbs 5 Ounces: OFC 0in: Respiratory Rate 0: O2 Saturation 0: Pain Level 8: BMI 27  BMI : 27.1 kg/m2: Diastolic 103: Heart Rate 104: Temp 96.87F: Height 66ft 11in: Weight 194Lbs 5 Ounces: OFC 0in: Respiratory Rate 0: O2 Saturation 0: Pain Level 8: BMI 27  BMI : 27.1 kg/m2  Subjective: This 46 Years 34 Months old Male presents for of  HERNIA: ,Has worsening right groin pain.  Made worse with straining.  No nausea, vomiting.  Urology found hernia.  Review of Symptoms:  Constitutional:unremarkable Head:unremarkable Eyes:blurred vision bilateral,pain bilateral Nose/Mouth/Throat:unremarkable Cardiovascular:unremarkable Respiratory:unremarkable Gastrointestinabdominal pain Genitourinary:unremarkable joint and back pain Skin:unremarkable Hematolgic/Lymphatic:unremarkable Allergic/Immunologic:unremarkable   Past Medical History:Reviewed   Past Medical History  Surgical History: Tonsils Medical Problems:  High Blood pressure, Epilepsy (seizures) Psychiatric History:  Anxiety, Depression Allergies: PCN Medications: clonazepam, lamictal, lexapro, phenytoin   Social History:Reviewed   Social History  Preferred Language: English ([place]) Race:  White Ethnicity: Not Hispanic / Latino Age: 46 Years 45 Months Marital Status:  S Alcohol:  No Recreational drug(s):  No   Smoking Status: Current every day smoker reviewed on 12/31/2011 Started Date: 11/09/1986 Packs per day: 1.00   Family History:Reviewed   Family History  Is there a family history [id number]    Objective Information: General:Well appearing, well nourished in no distress. Head:Atraumatic; no masses; no abnormalities Neck:Supple without lymphadenopathy.  Heart:RRR, no murmur or gallop.  Normal S1, S2.  No S3, S4.  Lungs:CTA bilaterally, no wheezes, rhonchi, rales.  Breathing unlabored. Abdomen:Soft, NT/ND, normal bowel sounds, no HSM, no masses.  No peritoneal  signs.Reudcible right inguinal hernia. XB:[id number]  Assessment:Right inguinal hernia  Diagnosis &amp; Procedure: DiagnosisCode: 550.90, ProcedureCode: 86578,    Plan:Scheduled for right inguinal herniorrhaphy on 01/06/12.   Patient Education:Alternative treatments to surgery were discussed with patient (and family).Risks and benefits  of procedure were fully explained to the patient (and family) who gave informed consent. Patient/family questions were addressed.  Follow-up:Pending Surgery

## 2012-01-01 ENCOUNTER — Encounter (HOSPITAL_COMMUNITY)
Admission: RE | Admit: 2012-01-01 | Discharge: 2012-01-01 | Disposition: A | Discharge status: Home / Self Care | Payer: PRIVATE HEALTH INSURANCE | Source: Ambulatory Visit | Attending: General Surgery | Admitting: General Surgery

## 2012-01-01 ENCOUNTER — Encounter (HOSPITAL_COMMUNITY): Payer: Self-pay

## 2012-01-01 HISTORY — DX: Anxiety disorder, unspecified: F41.9

## 2012-01-01 LAB — CBC
HCT: 51.1 % (ref 39.0–52.0)
MCH: 31.5 pg (ref 26.0–34.0)
MCHC: 33.9 g/dL (ref 30.0–36.0)
MCV: 92.9 fL (ref 78.0–100.0)
Platelets: 127 10*3/uL — ABNORMAL LOW (ref 150–400)
RDW: 13.5 % (ref 11.5–15.5)
WBC: 6.2 10*3/uL (ref 4.0–10.5)

## 2012-01-01 LAB — DIFFERENTIAL
Basophils Absolute: 0 10*3/uL (ref 0.0–0.1)
Basophils Relative: 0 % (ref 0–1)
Eosinophils Absolute: 0.1 10*3/uL (ref 0.0–0.7)
Eosinophils Relative: 2 % (ref 0–5)
Lymphocytes Relative: 24 % (ref 12–46)
Monocytes Absolute: 0.6 10*3/uL (ref 0.1–1.0)

## 2012-01-01 LAB — PHENYTOIN LEVEL, TOTAL: Phenytoin Lvl: 11.2 ug/mL (ref 10.0–20.0)

## 2012-01-01 LAB — BASIC METABOLIC PANEL
BUN: 13 mg/dL (ref 6–23)
CO2: 25 mEq/L (ref 19–32)
Calcium: 9.1 mg/dL (ref 8.4–10.5)
Chloride: 106 mEq/L (ref 96–112)
Creatinine, Ser: 0.72 mg/dL (ref 0.50–1.35)

## 2012-01-01 LAB — SURGICAL PCR SCREEN: MRSA, PCR: NEGATIVE

## 2012-01-01 NOTE — Patient Instructions (Signed)
21 Jeffrey Wilcox  01/01/2012   Your procedure is scheduled on:  01/06/12  Report to [facility] at 07:30 AM.  Call this number if you have problems the morning of surgery: [phone]   Remember:   Do not eat food:After Midnight.  May have clear liquids:until Midnight .  Clear liquids include soda, tea, black coffee, apple or grape juice, broth.  Take these medicines the morning of surgery with A SIP OF WATER: Lexapro, Dilantin, Lamictal and Clonazepam. Take your Flexeril and Oxycodone only if needed.   Do not wear jewelry, make-up or nail polish.  Do not wear lotions, powders, or perfumes. You may wear deodorant.  Do not shave 48 hours prior to surgery.  Do not bring valuables to the hospital.  Contacts, dentures or bridgework may not be worn into surgery.  Leave suitcase in the car. After surgery it may be brought to your room.  For patients admitted to the hospital, checkout time is 11:00 AM the day of discharge.   Patients discharged the day of surgery will not be allowed to drive home.  Name and phone number of your driver:   Special Instructions: CHG Shower Use Special Wash: 1/2 bottle night before surgery and 1/2 bottle morning of surgery.   Please read over the following fact sheets that you were given: Pain Booklet, MRSA Information, Surgical Site Infection Prevention, Anesthesia Post-op Instructions and Care and Recovery After Surgery    Hernia, Surgical Repair A hernia occurs when an internal organ pushes out through a weak spot in the belly (abdominal) wall muscles. Hernias commonly occur in the groin and around the navel. Hernias often can be pushed back into place (reduced). Most hernias tend to get worse over time. Problems occur when abdominal contents get stuck in the opening (incarcerated hernia). The blood supply gets cut off (strangulated hernia). This is an emergency and needs surgery. Otherwise, hernia repair can be an elective procedure. This means you can schedule  this at your convenience when an emergency is not present. Because complications can occur, if you decide to repair the hernia, it is best to do it soon. When it becomes an emergency procedure, there is increased risk of complications after surgery. CAUSES   Heavy lifting.   Obesity.   Prolonged coughing.   Straining to move your bowels.   Hernias can also occur through a cut (incision) by a surgeonafter an abdominal operation.  HOME CARE INSTRUCTIONS Before the repair:  Bed rest is not required. You may continue your normal activities, but avoid heavy lifting (more than 10 pounds) or straining. Cough gently. If you are a smoker, it is best to stop. Even the best hernia repair can break down with the continual strain of coughing.   Do not wear anything tight over your hernia. Do not try to keep it in with an outside bandage or truss. These can damage abdominal contents if they are trapped in the hernia sac.   Eat a normal diet. Avoid constipation. Straining over long periods of time to have a bowel movement will increase hernia size. It also can breakdown repairs. If you cannot do this with diet alone, laxatives or stool softeners may be used.  PRIOR TO SURGERY, SEEK IMMEDIATE MEDICAL CARE IF: You have problems (symptoms) of a trapped (incarcerated) hernia. Symptoms include:  An oral temperature above 102 F (38.9 C) develops, or as your caregiver suggests.   Increasing abdominal pain.   Feeling sick to your stomach(nausea) and vomiting.  You stop passing gas or stool.   The hernia is stuck outside the abdomen, looks discolored, feels hard, or is tender.   You have any changes in your bowel habits or in the hernia that is unusual for you.  LET YOUR CAREGIVERS KNOW ABOUT THE FOLLOWING:  Allergies.   Medications taken including herbs, eye drops, over the counter medications, and creams.   Use of steroids (by mouth or creams).   Family or personal history of problems with  anesthetics or Novocaine.   Possibility of pregnancy, if this applies.   Personal history of blood clots (thrombophlebitis).   Family or personal history of bleeding or blood problems.   Previous surgery.   Other health problems.  BEFORE THE PROCEDURE You should be present 1 hour prior to your procedure, or as directed by your caregiver.  AFTER THE PROCEDURE After surgery, you will be taken to the recovery area. A nurse will watch and check your progress there. Once you are awake, stable, and taking fluids well, you will be allowed to go home as long as there are no problems. Once home, an ice pack (wrapped in a light towel) applied to your operative site may help with discomfort. It may also keep the swelling down. Do not lift anything heavier than 10 pounds (4.55 kilograms). Take showers not baths. Do not drive while taking narcotics. Follow instructions as suggested by your caregiver.  SEEK IMMEDIATE MEDICAL CARE IF: After surgery:  There is redness, swelling, or increasing pain in the wound.   There is pus coming from the wound.   There is drainage from a wound lasting longer than 1 day.   An unexplained oral temperature above 102 F (38.9 C) develops.   You notice a foul smell coming from the wound or dressing.   There is a breaking open of a wound (edged not staying together) after the sutures have been removed.   You notice increasing pain in the shoulders (shoulder strap areas).   You develop dizzy episodes or fainting while standing.   You develop persistent nausea or vomiting.   You develop a rash.   You have difficulty breathing.   You develop any reaction or side effects to medications given.  MAKE SURE YOU:   Understand these instructions.   Will watch your condition.   Will get help right away if you are not doing well or get worse.  Document Released: 04/21/2001 Document Revised: 07/08/2011 Document Reviewed: 03/13/2008 [facility] Patient Information  2012 [place], [place].   PATIENT INSTRUCTIONS POST-ANESTHESIA  IMMEDIATELY FOLLOWING SURGERY:  Do not drive or operate machinery for the first twenty four hours after surgery.  Do not make any important decisions for twenty four hours after surgery or while taking narcotic pain medications or sedatives.  If you develop intractable nausea and vomiting or a severe headache please notify your doctor immediately.  FOLLOW-UP:  Please make an appointment with your surgeon as instructed. You do not need to follow up with anesthesia unless specifically instructed to do so.  WOUND CARE INSTRUCTIONS (if applicable):  Keep a dry clean dressing on the anesthesia/puncture wound site if there is drainage.  Once the wound has quit draining you may leave it open to air.  Generally you should leave the bandage intact for twenty four hours unless there is drainage.  If the epidural site drains for more than 36-48 hours please call the anesthesia department.  QUESTIONS?:  Please feel free to call your physician or the hospital operator  if you have any questions, and they will be happy to assist you.     [facility] Anesthesia Department [address] [place] [phone]

## 2012-01-06 ENCOUNTER — Encounter (HOSPITAL_COMMUNITY): Payer: Self-pay | Admitting: Anesthesiology

## 2012-01-06 ENCOUNTER — Encounter (HOSPITAL_COMMUNITY)
Admission: RE | Disposition: A | Discharge status: Home / Self Care | Payer: Self-pay | Source: Ambulatory Visit | Attending: General Surgery

## 2012-01-06 ENCOUNTER — Ambulatory Visit (HOSPITAL_COMMUNITY): Payer: PRIVATE HEALTH INSURANCE | Admitting: Anesthesiology

## 2012-01-06 ENCOUNTER — Encounter (HOSPITAL_COMMUNITY): Payer: Self-pay | Admitting: *Deleted

## 2012-01-06 ENCOUNTER — Ambulatory Visit (HOSPITAL_COMMUNITY)
Admission: RE | Admit: 2012-01-06 | Discharge: 2012-01-06 | Disposition: A | Discharge status: Home / Self Care | Payer: PRIVATE HEALTH INSURANCE | Source: Ambulatory Visit | Attending: General Surgery | Admitting: General Surgery

## 2012-01-06 DIAGNOSIS — K4090 Unilateral inguinal hernia, without obstruction or gangrene, not specified as recurrent: Secondary | ICD-10-CM | POA: Insufficient documentation

## 2012-01-06 DIAGNOSIS — Z01812 Encounter for preprocedural laboratory examination: Secondary | ICD-10-CM | POA: Insufficient documentation

## 2012-01-06 HISTORY — PX: INGUINAL HERNIA REPAIR: SHX194

## 2012-01-06 SURGERY — REPAIR, HERNIA, INGUINAL, ADULT
Anesthesia: General | Site: Groin | Laterality: Right | Wound class: Clean

## 2012-01-06 MED ORDER — SODIUM CHLORIDE 0.9 % IR SOLN
Status: DC | PRN
  Administered 2012-01-06: 1000 mL

## 2012-01-06 MED ORDER — KETOROLAC TROMETHAMINE 30 MG/ML IJ SOLN
30.0000 mg | Freq: Once | INTRAMUSCULAR | Status: AC
  Administered 2012-01-06: 30 mg via INTRAVENOUS

## 2012-01-06 MED ORDER — BUPIVACAINE HCL (PF) 0.5 % IJ SOLN
INTRAMUSCULAR | Status: AC
  Filled 2012-01-06: qty 30

## 2012-01-06 MED ORDER — KETOROLAC TROMETHAMINE 30 MG/ML IJ SOLN
INTRAMUSCULAR | Status: AC
  Administered 2012-01-06: 30 mg via INTRAVENOUS
  Filled 2012-01-06: qty 1

## 2012-01-06 MED ORDER — ONDANSETRON HCL 4 MG/2ML IJ SOLN
INTRAMUSCULAR | Status: DC | PRN
  Administered 2012-01-06: 4 mg via INTRAVENOUS

## 2012-01-06 MED ORDER — LACTATED RINGERS IV SOLN
INTRAVENOUS | Status: DC | PRN
  Administered 2012-01-06 (×2): via INTRAVENOUS

## 2012-01-06 MED ORDER — PROPOFOL 10 MG/ML IV EMUL
INTRAVENOUS | Status: AC
  Filled 2012-01-06: qty 20

## 2012-01-06 MED ORDER — PROPOFOL 10 MG/ML IV EMUL
INTRAVENOUS | Status: DC | PRN
  Administered 2012-01-06: 200 mg via INTRAVENOUS

## 2012-01-06 MED ORDER — ONDANSETRON HCL 4 MG/2ML IJ SOLN
INTRAMUSCULAR | Status: AC
  Filled 2012-01-06: qty 2

## 2012-01-06 MED ORDER — LACTATED RINGERS IV SOLN
INTRAVENOUS | Status: DC
  Administered 2012-01-06: 08:00:00 via INTRAVENOUS

## 2012-01-06 MED ORDER — MIDAZOLAM HCL 2 MG/2ML IJ SOLN
INTRAMUSCULAR | Status: AC
  Filled 2012-01-06: qty 2

## 2012-01-06 MED ORDER — OXYCODONE-ACETAMINOPHEN 5-325 MG PO TABS: 1.0000 | ORAL_TABLET | ORAL | Status: DC | PRN

## 2012-01-06 MED ORDER — HYDRALAZINE HCL 20 MG/ML IJ SOLN
INTRAMUSCULAR | Status: AC
  Administered 2012-01-06: 5 mg via INTRAVENOUS
  Filled 2012-01-06: qty 1

## 2012-01-06 MED ORDER — FENTANYL CITRATE 0.05 MG/ML IJ SOLN
INTRAMUSCULAR | Status: AC
  Administered 2012-01-06: 50 ug via INTRAVENOUS
  Filled 2012-01-06: qty 5

## 2012-01-06 MED ORDER — ENOXAPARIN SODIUM 40 MG/0.4ML ~~LOC~~ SOLN
SUBCUTANEOUS | Status: AC
  Filled 2012-01-06: qty 0.4

## 2012-01-06 MED ORDER — ENOXAPARIN SODIUM 40 MG/0.4ML ~~LOC~~ SOLN
40.0000 mg | Freq: Once | SUBCUTANEOUS | Status: AC
  Administered 2012-01-06: 40 mg via SUBCUTANEOUS

## 2012-01-06 MED ORDER — MIDAZOLAM HCL 2 MG/2ML IJ SOLN
1.0000 mg | INTRAMUSCULAR | Status: DC | PRN
  Administered 2012-01-06: 2 mg via INTRAVENOUS

## 2012-01-06 MED ORDER — HYDRALAZINE HCL 20 MG/ML IJ SOLN
5.0000 mg | INTRAMUSCULAR | Status: DC | PRN
  Administered 2012-01-06: 5 mg via INTRAVENOUS

## 2012-01-06 MED ORDER — ONDANSETRON HCL 4 MG/2ML IJ SOLN: 4.0000 mg | Freq: Once | INTRAMUSCULAR | Status: DC | PRN

## 2012-01-06 MED ORDER — VANCOMYCIN HCL IN DEXTROSE 1-5 GM/200ML-% IV SOLN
1000.0000 mg | INTRAVENOUS | Status: AC
  Administered 2012-01-06: 1000 mg via INTRAVENOUS

## 2012-01-06 MED ORDER — FENTANYL CITRATE 0.05 MG/ML IJ SOLN
INTRAMUSCULAR | Status: AC
  Administered 2012-01-06: 50 ug via INTRAVENOUS
  Filled 2012-01-06: qty 2

## 2012-01-06 MED ORDER — BUPIVACAINE HCL (PF) 0.5 % IJ SOLN
INTRAMUSCULAR | Status: DC | PRN
  Administered 2012-01-06: 10 mL

## 2012-01-06 MED ORDER — FENTANYL CITRATE 0.05 MG/ML IJ SOLN
INTRAMUSCULAR | Status: DC | PRN
  Administered 2012-01-06: 25 ug via INTRAVENOUS
  Administered 2012-01-06: 50 ug via INTRAVENOUS
  Administered 2012-01-06: 100 ug via INTRAVENOUS
  Administered 2012-01-06 (×2): 50 ug via INTRAVENOUS
  Administered 2012-01-06: 25 ug via INTRAVENOUS
  Administered 2012-01-06: 50 ug via INTRAVENOUS

## 2012-01-06 MED ORDER — VANCOMYCIN HCL IN DEXTROSE 1-5 GM/200ML-% IV SOLN
INTRAVENOUS | Status: AC
  Filled 2012-01-06: qty 200

## 2012-01-06 MED ORDER — FENTANYL CITRATE 0.05 MG/ML IJ SOLN
25.0000 ug | INTRAMUSCULAR | Status: DC | PRN
  Administered 2012-01-06 (×4): 50 ug via INTRAVENOUS

## 2012-01-06 SURGICAL SUPPLY — 39 items
ADH SKN CLS APL DERMABOND .7 (GAUZE/BANDAGES/DRESSINGS) ×1
BAG HAMPER (MISCELLANEOUS) ×2 IMPLANT
CLOTH BEACON ORANGE TIMEOUT ST (SAFETY) ×2 IMPLANT
COVER LIGHT HANDLE STERIS (MISCELLANEOUS) ×4 IMPLANT
DECANTER SPIKE VIAL GLASS SM (MISCELLANEOUS) ×2 IMPLANT
DERMABOND ADVANCED (GAUZE/BANDAGES/DRESSINGS) ×1
DERMABOND ADVANCED .7 DNX12 (GAUZE/BANDAGES/DRESSINGS) ×1 IMPLANT
DRAIN PENROSE 18X.75 LTX STRL (MISCELLANEOUS) ×2 IMPLANT
ELECT REM PT RETURN 9FT ADLT (ELECTROSURGICAL) ×2
ELECTRODE REM PT RTRN 9FT ADLT (ELECTROSURGICAL) ×1 IMPLANT
FORMALIN 10 PREFIL 120ML (MISCELLANEOUS) IMPLANT
GLOVE BIO SURGEON STRL SZ7.5 (GLOVE) ×2 IMPLANT
GLOVE ECLIPSE 6.5 STRL STRAW (GLOVE) ×2 IMPLANT
GLOVE INDICATOR 7.0 STRL GRN (GLOVE) ×2 IMPLANT
GOWN STRL REIN XL XLG (GOWN DISPOSABLE) ×6 IMPLANT
INST SET MINOR GENERAL (KITS) ×2 IMPLANT
KIT ROOM TURNOVER APOR (KITS) ×2 IMPLANT
MANIFOLD NEPTUNE II (INSTRUMENTS) ×2 IMPLANT
MESH MARLEX PLUG MEDIUM (Mesh General) ×1 IMPLANT
NDL HYPO 25X1 1.5 SAFETY (NEEDLE) ×1 IMPLANT
NEEDLE HYPO 25X1 1.5 SAFETY (NEEDLE) ×2 IMPLANT
NS IRRIG 1000ML POUR BTL (IV SOLUTION) ×2 IMPLANT
PACK MINOR (CUSTOM PROCEDURE TRAY) ×2 IMPLANT
PAD ARMBOARD 7.5X6 YLW CONV (MISCELLANEOUS) ×2 IMPLANT
PAIN PUMP ON-Q 100MLX2ML 2.5IN (PAIN MANAGEMENT) IMPLANT
SET BASIN LINEN APH (SET/KITS/TRAYS/PACK) ×2 IMPLANT
SOL PREP PROV IODINE SCRUB 4OZ (MISCELLANEOUS) ×2 IMPLANT
SUT NOVA NAB GS-22 2 2-0 T-19 (SUTURE) ×3 IMPLANT
SUT NOVAFIL NAB HGS22 2-0 30IN (SUTURE) IMPLANT
SUT SILK 3 0 (SUTURE)
SUT SILK 3-0 18XBRD TIE 12 (SUTURE) ×1 IMPLANT
SUT VIC AB 2-0 CT1 27 (SUTURE) ×2
SUT VIC AB 2-0 CT1 TAPERPNT 27 (SUTURE) ×1 IMPLANT
SUT VIC AB 3-0 SH 27 (SUTURE) ×2
SUT VIC AB 3-0 SH 27X BRD (SUTURE) ×1 IMPLANT
SUT VIC AB 4-0 PS2 27 (SUTURE) ×2 IMPLANT
SUT VICRYL AB 3 0 TIES (SUTURE) IMPLANT
SYR CONTROL 10ML LL (SYRINGE) ×2 IMPLANT
TOWEL OR 17X26 4PK STRL BLUE (TOWEL DISPOSABLE) ×1 IMPLANT

## 2012-01-06 NOTE — Preoperative (Signed)
Beta Blockers   Reason not to administer Beta Blockers:Not Applicable 

## 2012-01-06 NOTE — Op Note (Signed)
Patient:  Jeffrey Wilcox  DOB:  26-Mar-1966  MRN:  [id number]   Preop Diagnosis:  Right inguinal hernia  Postop Diagnosis:  Same  Procedure:  Right inguinal herniorrhaphy  Surgeon:  Franky Macho, M.D.  Anes:  General  Indications:  Patient is a 46 year old white male presents with a symptomatic right inguinal hernia. The risks and benefits of the procedure including bleeding, infection, and a possibly recurrence the hernia were fully explained to the patient, gave informed consent.  Procedure note:  Patient's placed the supine position after general anesthesia was administered. The right groin region was prepped and draped using usual sterile technique with Betadine. Surgical site confirmation was performed.  A transverse incision was made in the right groin region down to the external oblique a Penrose CT. The aponeurosis was incised to the external ring. A Penrose drain was placed around the spermatic cord. The vas deferens was noted within the spermatic cord. A lipoma of the cord was excised at difficulty. The patient had a direct hernia. This was incised at its base and a medium size PerFix plug was then inserted into this region secured to the transversalis fascia using a 20 Novafil interrupted suture. An onlay patch was then placed along the floor of inguinal canal and secured superiorly to the conjoined tendon and inferiorly to the shelving edge of Poupart's ligament using a 2-0 Novafil interrupted suture. The internal ring was recreated using 2-0 Novafil interrupted suture. The external oblique aponeuroses was reapproximated using 2-0 Vicryl running suture. Subcutaneous layer was reapproximated using 3-0 Vicryl interrupted suture. The skin was closed using a 4 Vicryl subcuticular suture. 0.5% Sensorcaine was instilled the surrounding wound. Dermabond was then applied.  All tape and needle counts were correct the end of the procedure. Patient was extubated in the operating room went back  to recovery room awake in stable condition.  Complications:  None  EBL:  Minimal  Specimen:  None

## 2012-01-06 NOTE — Anesthesia Preprocedure Evaluation (Signed)
Anesthesia Evaluation  Patient identified by MRN, date of birth, ID band Patient awake    Reviewed: Allergy & Precautions, H&P , NPO status , Patient's Chart, lab work & pertinent test results  Airway Mallampati: I      Dental  (+) Edentulous Upper and Edentulous Lower   Pulmonary Current Smoker,  clear to auscultation        Cardiovascular neg cardio ROS Regular Normal    Neuro/Psych Seizures -,  PSYCHIATRIC DISORDERS Anxiety Depression    GI/Hepatic   Endo/Other    Renal/GU      Musculoskeletal  (+) Arthritis -, Rheumatoid disorders,    Abdominal   Peds  Hematology   Anesthesia Other Findings   Reproductive/Obstetrics                           Anesthesia Physical Anesthesia Plan  ASA: III  Anesthesia Plan: General   Post-op Pain Management:    Induction: Intravenous  Airway Management Planned: LMA  Additional Equipment:   Intra-op Plan:   Post-operative Plan: Extubation in OR  Informed Consent: I have reviewed the patients History and Physical, chart, labs and discussed the procedure including the risks, benefits and alternatives for the proposed anesthesia with the patient or authorized representative who has indicated his/her understanding and acceptance.     Plan Discussed with:   Anesthesia Plan Comments:         Anesthesia Quick Evaluation

## 2012-01-06 NOTE — Anesthesia Postprocedure Evaluation (Signed)
  Anesthesia Post-op Note  Patient: Jeffrey Wilcox  Procedure(s) Performed: Procedure(s) (LRB): HERNIA REPAIR INGUINAL ADULT (Right)  Patient Location: PACU  Anesthesia Type: General  Level of Consciousness: awake, alert  and oriented  Airway and Oxygen Therapy: Patient Spontanous Breathing and Patient connected to nasal cannula oxygen  Post-op Pain: none  Post-op Assessment: Post-op Vital signs reviewed, Patient's Cardiovascular Status Stable, Respiratory Function Stable, Patent Airway and No signs of Nausea or vomiting  Post-op Vital Signs: Reviewed and stable  Complications: No apparent anesthesia complications

## 2012-01-06 NOTE — Interval H&P Note (Signed)
History and Physical Interval Note:  01/06/2012 8:50 AM  Jeffrey Wilcox  has presented today for surgery, with the diagnosis of Inguinal hernia without mention of obstruction or gangrene, unilateral or unspecified, not specified as recurrent  The various methods of treatment have been discussed with the patient and family. After consideration of risks, benefits and other options for treatment, the patient has consented to  Procedure(s) (LRB): HERNIA REPAIR INGUINAL ADULT (Right) as a surgical intervention .  The patients' history has been reviewed, patient examined, no change in status, stable for surgery.  I have reviewed the patients' chart and labs.  Questions were answered to the patient's satisfaction.     [name]

## 2012-01-06 NOTE — Discharge Instructions (Signed)
Inguinal Hernia, Adult  Care After Refer to this sheet in the next few weeks. These discharge instructions provide you with general information on caring for yourself after you leave the hospital. Your caregiver may also give you specific instructions. Your treatment has been planned according to the most current medical practices available, but unavoidable complications sometimes occur. If you have any problems or questions after discharge, please call your caregiver. HOME CARE INSTRUCTIONS  Put ice on the operative site.   Put ice in a plastic bag.   Place a towel between your skin and the bag.   Leave the ice on for 15 to 20 minutes at a time, 3 to 4 times a day while awake.   Change bandages (dressings) as directed.   Keep the wound dry and clean. The wound may be washed gently with soap and water. Gently blot or dab the wound dry. It is okay to take showers 24 to 48 hours after surgery. Do not take baths, use swimming pools, or use hot tubs for 10 days, or as directed by your caregiver.   Only take over-the-counter or prescription medicines for pain, discomfort, or fever as directed by your caregiver.   Continue your normal diet as directed.   Do not lift anything more than 10 pounds or play contact sports for 3 weeks, or as directed.  SEEK MEDICAL CARE IF:  There is redness, swelling, or increasing pain in the wound.   There is fluid (pus) coming from the wound.   There is drainage from a wound lasting longer than 1 day.   You have an oral temperature above 102 F (38.9 C).   You notice a bad smell coming from the wound or dressing.   The wound breaks open after the stitches (sutures) have been removed.   You notice increasing pain in the shoulders (shoulder strap areas).   You develop dizzy episodes or fainting while standing.   You feel sick to your stomach (nauseous) or throw up (vomit).  SEEK IMMEDIATE MEDICAL CARE IF:  You develop a rash.   You have difficulty  breathing.   You develop a reaction or have side effects to medicines you were given.  MAKE SURE YOU:   Understand these instructions.   Will watch your condition.   Will get help right away if you are not doing well or get worse.  Document Released: 11/26/2006 Document Revised: 07/08/2011 Document Reviewed: 09/25/2009 [employer] Patient Information 2012 [employer]. 

## 2012-01-06 NOTE — Transfer of Care (Signed)
Immediate Anesthesia Transfer of Care Note  Patient: Jeffrey Wilcox  Procedure(s) Performed: Procedure(s) (LRB): HERNIA REPAIR INGUINAL ADULT (Right)  Patient Location: PACU  Anesthesia Type: General  Level of Consciousness: awake, alert  and oriented  Airway & Oxygen Therapy: Patient Spontanous Breathing and Patient connected to nasal cannula oxygen  Post-op Assessment: Report given to PACU RN and Post -op Vital signs reviewed and stable  Post vital signs: Reviewed and stable  Complications: No apparent anesthesia complications

## 2012-01-06 NOTE — Progress Notes (Signed)
Dr Jayme Cloud notified of elevated BP. Order given for hydralazine.

## 2012-01-08 ENCOUNTER — Ambulatory Visit: Payer: PRIVATE HEALTH INSURANCE | Admitting: Urology

## 2012-01-08 NOTE — Progress Notes (Signed)
Pt continues to complain of pain after taking Percocet 5/325 2 tablets every 4-6 hours.  Dr. Lovell Sheehan notified of pt's complaint.  Dr. Lovell Sheehan will call pt at home and follow up.

## 2012-01-12 ENCOUNTER — Encounter (HOSPITAL_COMMUNITY): Payer: Self-pay | Admitting: General Surgery

## 2012-03-11 ENCOUNTER — Ambulatory Visit (INDEPENDENT_AMBULATORY_CARE_PROVIDER_SITE_OTHER): Payer: PRIVATE HEALTH INSURANCE | Admitting: Urology

## 2012-03-11 DIAGNOSIS — N3941 Urge incontinence: Secondary | ICD-10-CM

## 2012-03-11 DIAGNOSIS — N509 Disorder of male genital organs, unspecified: Secondary | ICD-10-CM

## 2012-03-11 DIAGNOSIS — N401 Enlarged prostate with lower urinary tract symptoms: Secondary | ICD-10-CM

## 2012-03-24 ENCOUNTER — Encounter (HOSPITAL_COMMUNITY): Payer: Self-pay | Admitting: *Deleted

## 2012-03-24 ENCOUNTER — Emergency Department (HOSPITAL_COMMUNITY)
Admission: EM | Admit: 2012-03-24 | Discharge: 2012-03-24 | Disposition: A | Discharge status: Home / Self Care | Payer: PRIVATE HEALTH INSURANCE | Attending: Emergency Medicine | Admitting: Emergency Medicine

## 2012-03-24 ENCOUNTER — Emergency Department (HOSPITAL_COMMUNITY): Payer: PRIVATE HEALTH INSURANCE

## 2012-03-24 DIAGNOSIS — R112 Nausea with vomiting, unspecified: Secondary | ICD-10-CM | POA: Insufficient documentation

## 2012-03-24 DIAGNOSIS — R197 Diarrhea, unspecified: Secondary | ICD-10-CM | POA: Insufficient documentation

## 2012-03-24 DIAGNOSIS — A084 Viral intestinal infection, unspecified: Secondary | ICD-10-CM

## 2012-03-24 DIAGNOSIS — R7989 Other specified abnormal findings of blood chemistry: Secondary | ICD-10-CM | POA: Insufficient documentation

## 2012-03-24 DIAGNOSIS — A088 Other specified intestinal infections: Secondary | ICD-10-CM | POA: Insufficient documentation

## 2012-03-24 DIAGNOSIS — T887XXA Unspecified adverse effect of drug or medicament, initial encounter: Secondary | ICD-10-CM

## 2012-03-24 DIAGNOSIS — T888XXA Other specified complications of surgical and medical care, not elsewhere classified, initial encounter: Secondary | ICD-10-CM | POA: Insufficient documentation

## 2012-03-24 DIAGNOSIS — R229 Localized swelling, mass and lump, unspecified: Secondary | ICD-10-CM | POA: Insufficient documentation

## 2012-03-24 DIAGNOSIS — Y849 Medical procedure, unspecified as the cause of abnormal reaction of the patient, or of later complication, without mention of misadventure at the time of the procedure: Secondary | ICD-10-CM | POA: Insufficient documentation

## 2012-03-24 DIAGNOSIS — T420X5A Adverse effect of hydantoin derivatives, initial encounter: Secondary | ICD-10-CM | POA: Insufficient documentation

## 2012-03-24 DIAGNOSIS — R569 Unspecified convulsions: Secondary | ICD-10-CM | POA: Insufficient documentation

## 2012-03-24 DIAGNOSIS — R7889 Finding of other specified substances, not normally found in blood: Secondary | ICD-10-CM

## 2012-03-24 DIAGNOSIS — T801XXA Vascular complications following infusion, transfusion and therapeutic injection, initial encounter: Secondary | ICD-10-CM

## 2012-03-24 LAB — COMPREHENSIVE METABOLIC PANEL
AST: 16 U/L (ref 0–37)
Albumin: 3.9 g/dL (ref 3.5–5.2)
Alkaline Phosphatase: 164 U/L — ABNORMAL HIGH (ref 39–117)
BUN: 9 mg/dL (ref 6–23)
Chloride: 103 mEq/L (ref 96–112)
Potassium: 3.8 mEq/L (ref 3.5–5.1)
Total Bilirubin: 0.4 mg/dL (ref 0.3–1.2)

## 2012-03-24 LAB — URINALYSIS, ROUTINE W REFLEX MICROSCOPIC
Bilirubin Urine: NEGATIVE
Glucose, UA: NEGATIVE mg/dL
Ketones, ur: NEGATIVE mg/dL
pH: 6 (ref 5.0–8.0)

## 2012-03-24 LAB — CBC
Hemoglobin: 17.7 g/dL — ABNORMAL HIGH (ref 13.0–17.0)
MCHC: 34.7 g/dL (ref 30.0–36.0)
RBC: 5.53 MIL/uL (ref 4.22–5.81)
WBC: 5.7 10*3/uL (ref 4.0–10.5)

## 2012-03-24 LAB — DIFFERENTIAL
Basophils Relative: 1 % (ref 0–1)
Monocytes Relative: 7 % (ref 3–12)
Neutro Abs: 3.5 10*3/uL (ref 1.7–7.7)
Neutrophils Relative %: 60 % (ref 43–77)

## 2012-03-24 MED ORDER — ONDANSETRON HCL 4 MG/2ML IJ SOLN
4.0000 mg | Freq: Once | INTRAMUSCULAR | Status: AC
  Administered 2012-03-24: 4 mg via INTRAVENOUS
  Filled 2012-03-24: qty 2

## 2012-03-24 MED ORDER — SODIUM CHLORIDE 0.9 % IV SOLN
INTRAVENOUS | Status: DC
  Administered 2012-03-24: 12:00:00 via INTRAVENOUS

## 2012-03-24 MED ORDER — PREDNISONE 20 MG PO TABS
60.0000 mg | ORAL_TABLET | Freq: Once | ORAL | Status: AC
  Administered 2012-03-24: 60 mg via ORAL
  Filled 2012-03-24: qty 3

## 2012-03-24 MED ORDER — DIPHENHYDRAMINE HCL 50 MG/ML IJ SOLN: 25.0000 mg | Freq: Once | INTRAMUSCULAR | Status: DC

## 2012-03-24 MED ORDER — SODIUM CHLORIDE 0.9 % IV SOLN
1000.0000 mg | Freq: Once | INTRAVENOUS | Status: AC
  Administered 2012-03-24: 1000 mg via INTRAVENOUS
  Filled 2012-03-24: qty 20

## 2012-03-24 MED ORDER — DIPHENHYDRAMINE HCL 25 MG PO CAPS
50.0000 mg | ORAL_CAPSULE | Freq: Once | ORAL | Status: AC
  Administered 2012-03-24: 50 mg via ORAL
  Filled 2012-03-24: qty 2

## 2012-03-24 MED ORDER — OXYCODONE-ACETAMINOPHEN 5-325 MG PO TABS: 1.0000 | ORAL_TABLET | ORAL | Status: AC | PRN

## 2012-03-24 MED ORDER — FAMOTIDINE IN NACL 20-0.9 MG/50ML-% IV SOLN: 20.0000 mg | Freq: Once | INTRAVENOUS | Status: DC

## 2012-03-24 MED ORDER — NICOTINE 14 MG/24HR TD PT24
14.0000 mg | MEDICATED_PATCH | Freq: Once | TRANSDERMAL | Status: DC
  Administered 2012-03-24: 14 mg via TRANSDERMAL
  Filled 2012-03-24: qty 1

## 2012-03-24 MED ORDER — SODIUM CHLORIDE 0.9 % IV SOLN
INTRAVENOUS | Status: DC
  Administered 2012-03-24: 16:00:00 via INTRAVENOUS

## 2012-03-24 MED ORDER — MORPHINE SULFATE 4 MG/ML IJ SOLN
4.0000 mg | Freq: Once | INTRAMUSCULAR | Status: AC
  Administered 2012-03-24: 4 mg via INTRAVENOUS
  Filled 2012-03-24: qty 1

## 2012-03-24 MED ORDER — METHYLPREDNISOLONE SODIUM SUCC 125 MG IJ SOLR: 125.0000 mg | Freq: Once | INTRAMUSCULAR | Status: DC

## 2012-03-24 NOTE — ED Provider Notes (Addendum)
History  This chart was scribed for Carleene Cooper III, MD by Stevphen Meuse. This patient was seen in room APA14/[facility]14 and the patient's care was started at 11:32AM.   CSN: [id number]  Arrival date & time 03/24/12  1036   First MD Initiated Contact with Patient 03/24/12 1127      Chief Complaint  Patient presents with  . n/v/d     (Consider location/radiation/quality/duration/timing/severity/associated sxs/prior treatment) The history is provided by the patient. No language interpreter was used.   Jeffrey Wilcox is a 46 y.o. male who presents to the Emergency Department complaining of 4 days of gradual onset, gradually worsening vomiting with associated nausea and diarrhea. Pt states that he has been having repeated episodes of n/v/d since Sunday. Pt states that his current symptoms are aggravated by anything he eats and drinks. Pt is allergic to roses and states that he was planting roses for his mother on mothers day and pricked his finger. Pt has a h/o of seizures and is worried about keeping his medication down because of his current episodes of vomiting. Pt denies taking any medication to relieved his current symptoms. Pt denies fever, neck pain, SOB chest pain, but also reports back pain difficulty urinating as associated symptoms. Pt also has a h/o anxiety, degenerative disc disease, arthritis and severe depression. Pt is a current everyday smoker and is a recovering alcoholic for 12 years.     Past Medical History  Diagnosis Date  . Degenerative disc disease   . Severe depression   . Arthritis, rheumatoid   . Degenerative disc disease   . Seizures     last seizure was last week-usually happen about once a month  . Anxiety     Past Surgical History  Procedure Date  . Tonsillectomy   . Multiple tooth extractions   . Inguinal hernia repair 01/06/2012    Procedure: HERNIA REPAIR INGUINAL ADULT;  Surgeon: Dalia Heading, MD;  Location: [facility];  Service: General;   Laterality: Right;    Family History  Problem Relation Age of Onset  . Adopted: Yes    History  Substance Use Topics  . Smoking status: Current Everyday Smoker -- 0.5 packs/day for 20 years    Types: Cigarettes  . Smokeless tobacco: Former Neurosurgeon  . Alcohol Use: No     recovering alcoholic for 12 years      Review of Systems  Constitutional: Negative for fever.  HENT: Negative for neck pain and neck stiffness.   Respiratory: Negative for shortness of breath.   Cardiovascular: Negative for chest pain.  Gastrointestinal: Positive for nausea, vomiting and diarrhea.  Genitourinary: Negative for difficulty urinating.  Musculoskeletal: Positive for back pain.  Skin: Negative for rash.    Allergies  Penicillins and Other  Home Medications   Current Outpatient Rx  Name Route Sig Dispense Refill  . CLONAZEPAM 1 MG PO TABS Oral Take 1-3 mg by mouth 3 (three) times daily. 1mg  twice a day then 3 mg at bedtime    . CYCLOBENZAPRINE HCL 10 MG PO TABS Oral Take 10 mg by mouth 3 (three) times daily as needed.    Marland Kitchen ESCITALOPRAM OXALATE 20 MG PO TABS Oral Take 20 mg by mouth every morning.     . IBUPROFEN 800 MG PO TABS Oral Take 800 mg by mouth 2 (two) times daily as needed. For pain     . LAMOTRIGINE ER 200 MG PO TB24 Oral Take 1 tablet by mouth at bedtime.     Marland Kitchen  LAMOTRIGINE ER 50 MG PO TB24 Oral Take 50 mg by mouth daily.    Marland Kitchen MENTHOL (TOPICAL ANALGESIC) 7.5 % (ROLL) EX MISC Apply externally Apply 1 each topically as needed. For muscle aches and pain     . OXYCODONE-ACETAMINOPHEN 5-325 MG PO TABS Oral Take 1-2 tablets by mouth every 4 (four) hours as needed for pain. For pain 40 tablet 0  . PHENYTOIN SODIUM EXTENDED 300 MG PO CAPS Oral Take 300 mg by mouth 2 (two) times daily.       Triage Vital: BP 126/76  Pulse 84  Temp(Src) 97.8 F (36.6 C) (Oral)  Resp 20  Ht 5\' 10"  (1.778 m)  Wt 197 lb (89.359 kg)  BMI 28.27 kg/m2  SpO2 94%  Physical Exam  Nursing note and vitals  reviewed. Constitutional: He is oriented to person, place, and time. He appears well-developed and well-nourished.  HENT:  Head: Normocephalic and atraumatic.  Eyes: Conjunctivae and EOM are normal. Pupils are equal, round, and reactive to light.  Neck: Normal range of motion. Neck supple.  Cardiovascular: Normal rate and regular rhythm.   Pulmonary/Chest: Breath sounds normal.  Abdominal: Soft. Bowel sounds are normal.  Musculoskeletal: Normal range of motion. He exhibits no edema.  Neurological: He is alert and oriented to person, place, and time.  Skin: Skin is warm and dry.  Psychiatric: He has a normal mood and affect. His behavior is normal.    ED Course  Procedures (including critical care time)  DIAGNOSTIC STUDIES: Oxygen Saturation is 94% on room air, adequate  by my interpretation.    COORDINATION OF CARE:  11:38PM discussed administering IV fluids to rehydrate him from his continuous vomiting with pt and pt agreed.  3:19 PM Results for orders placed during the hospital encounter of 03/24/12  CBC      Component Value Range   WBC 5.7  4.0 - 10.5 (K/uL)   RBC 5.53  4.22 - 5.81 (MIL/uL)   Hemoglobin 17.7 (*) 13.0 - 17.0 (g/dL)   HCT 16.1  09.6 - 04.5 (%)   MCV 92.2  78.0 - 100.0 (fL)   MCH 32.0  26.0 - 34.0 (pg)   MCHC 34.7  30.0 - 36.0 (g/dL)   RDW 40.9  81.1 - 91.4 (%)   Platelets 139 (*) 150 - 400 (K/uL)  DIFFERENTIAL      Component Value Range   Neutrophils Relative 60  43 - 77 (%)   Neutro Abs 3.5  1.7 - 7.7 (K/uL)   Lymphocytes Relative 30  12 - 46 (%)   Lymphs Abs 1.7  0.7 - 4.0 (K/uL)   Monocytes Relative 7  3 - 12 (%)   Monocytes Absolute 0.4  0.1 - 1.0 (K/uL)   Eosinophils Relative 2  0 - 5 (%)   Eosinophils Absolute 0.1  0.0 - 0.7 (K/uL)   Basophils Relative 1  0 - 1 (%)   Basophils Absolute 0.0  0.0 - 0.1 (K/uL)  COMPREHENSIVE METABOLIC PANEL      Component Value Range   Sodium 137  135 - 145 (mEq/L)   Potassium 3.8  3.5 - 5.1 (mEq/L)    Chloride 103  96 - 112 (mEq/L)   CO2 25  19 - 32 (mEq/L)   Glucose, Bld 98  70 - 99 (mg/dL)   BUN 9  6 - 23 (mg/dL)   Creatinine, Ser 7.82  0.50 - 1.35 (mg/dL)   Calcium 9.3  8.4 - 95.6 (mg/dL)   Total Protein 7.0  6.0 - 8.3 (g/dL)   Albumin 3.9  3.5 - 5.2 (g/dL)   AST 16  0 - 37 (U/L)   ALT 17  0 - 53 (U/L)   Alkaline Phosphatase 164 (*) 39 - 117 (U/L)   Total Bilirubin 0.4  0.3 - 1.2 (mg/dL)   GFR calc non Af Amer >90  >90 (mL/min)   GFR calc Af Amer >90  >90 (mL/min)  LIPASE, BLOOD      Component Value Range   Lipase 54  11 - 59 (U/L)  URINALYSIS, ROUTINE W REFLEX MICROSCOPIC      Component Value Range   Color, Urine YELLOW  YELLOW    APPearance CLEAR  CLEAR    Specific Gravity, Urine >1.030 (*) 1.005 - 1.030    pH 6.0  5.0 - 8.0    Glucose, UA NEGATIVE  NEGATIVE (mg/dL)   Hgb urine dipstick NEGATIVE  NEGATIVE    Bilirubin Urine NEGATIVE  NEGATIVE    Ketones, ur NEGATIVE  NEGATIVE (mg/dL)   Protein, ur NEGATIVE  NEGATIVE (mg/dL)   Urobilinogen, UA 0.2  0.0 - 1.0 (mg/dL)   Nitrite NEGATIVE  NEGATIVE    Leukocytes, UA NEGATIVE  NEGATIVE   PHENYTOIN LEVEL, TOTAL      Component Value Range   Phenytoin Lvl 3.5 (*) 10.0 - 20.0 (ug/mL)   Dg Abd Acute W/chest  03/24/2012  *RADIOLOGY REPORT*  Clinical Data: Nausea and vomiting since Sunday, lower abdominal pain, smoker  ACUTE ABDOMEN SERIES (ABDOMEN 2 VIEW & CHEST 1 VIEW)  Comparison: Chest radiograph 07/04/2008  Findings: Normal heart size, mediastinal contours, and pulmonary vascularity. Emphysematous and bronchitic changes. No pulmonary infiltrate, pleural effusion, or pneumothorax. Nonspecific air filled upper caliber loops of small bowel in right upper and mid abdomen with scattered air-fluid levels on upright view. No definite bowel wall thickening. Minimal gas in the colon. No free intraperitoneal air or definite evidence of obstruction. Numerous pelvic phleboliths. Bones unremarkable.  IMPRESSION: Mild bronchitic and  emphysematous changes. Air filled upper normal caliber loops of small bowel in the right mid abdomen, nonspecific in appearance; these could reflect an ileus though early small bowel obstruction is not excluded. Otherwise negative exam.  Original Report Authenticated By: Lollie Marrow, M.D.    Lab tests suggest dehydration.  Dilantin level was subtherapeutic.  Pt feeling a little better. Will give second liter of normal saline, give Dilantin 1000 mg IV, try on clear liquids.     1. Viral gastroenteritis   2. Subtherapeutic serum dilantin level   3. IV infiltration   4. Non-dose-related adverse reaction to medication    4:18 PM Pt's Dilantin infiltrated into the right cubital fossa.  He has painful swelling in the right cubital fossa.  Call to Pharmacy, spoke to [place], advised that cold compresses should be applied for 15 to 20 minutes.  His skin appears red, so will give Benadryl and prednisone, Percocet for pain.    I personally performed the services described in this documentation, which was scribed in my presence. The recorded information has been reviewed and considered.  Osvaldo Human, M.D.     Carleene Cooper III, MD 03/24/12 1526    Carleene Cooper III, MD 03/25/12 2135

## 2012-03-24 NOTE — ED Notes (Signed)
Pt jumped off bed and got dressed. States he was leaving. Pt noted to have scattered red rash to arms and chest. Pt encouraged to return to room for further tx

## 2012-03-24 NOTE — Discharge Instructions (Signed)
Mr. Gloor, you had physical examination, laboratory tests and x-rays to check on you for vomiting for the past several days.  You were unable to take your anticonvulsant medicines during that time.  Lab tests today suggest that you have had a viral gastroenteritis.  You should drink clear liquids, to maintain your hydration.  You were given intravenous Dilantin to get your level back into the therapeutic range.

## 2012-03-24 NOTE — ED Notes (Signed)
N/v/d with lower back pain radiating into lower abd x 4 days.  Pt unable to keep seizure meds down and is worried will have a seizure.

## 2012-03-24 NOTE — ED Notes (Signed)
Pt refused to IV reinserted. edp aware

## 2012-03-24 NOTE — ED Notes (Signed)
IV discontinued due to infiltration. Pt refused to be stuck again. edp aware

## 2012-03-24 NOTE — ED Notes (Signed)
Discharge inst and rx given to pt by edp.

## 2012-05-06 ENCOUNTER — Ambulatory Visit (INDEPENDENT_AMBULATORY_CARE_PROVIDER_SITE_OTHER): Payer: PRIVATE HEALTH INSURANCE | Admitting: Urology

## 2012-05-06 DIAGNOSIS — R3911 Hesitancy of micturition: Secondary | ICD-10-CM

## 2012-05-06 DIAGNOSIS — R3915 Urgency of urination: Secondary | ICD-10-CM

## 2012-05-06 DIAGNOSIS — N138 Other obstructive and reflux uropathy: Secondary | ICD-10-CM

## 2012-05-06 DIAGNOSIS — N401 Enlarged prostate with lower urinary tract symptoms: Secondary | ICD-10-CM

## 2012-06-28 ENCOUNTER — Emergency Department (HOSPITAL_COMMUNITY)
Admission: EM | Admit: 2012-06-28 | Discharge: 2012-06-28 | Disposition: A | Discharge status: Home / Self Care | Payer: PRIVATE HEALTH INSURANCE | Attending: Emergency Medicine | Admitting: Emergency Medicine

## 2012-06-28 ENCOUNTER — Encounter (HOSPITAL_COMMUNITY): Payer: Self-pay | Admitting: *Deleted

## 2012-06-28 DIAGNOSIS — F3289 Other specified depressive episodes: Secondary | ICD-10-CM | POA: Insufficient documentation

## 2012-06-28 DIAGNOSIS — F411 Generalized anxiety disorder: Secondary | ICD-10-CM | POA: Insufficient documentation

## 2012-06-28 DIAGNOSIS — L02219 Cutaneous abscess of trunk, unspecified: Secondary | ICD-10-CM | POA: Insufficient documentation

## 2012-06-28 DIAGNOSIS — B356 Tinea cruris: Secondary | ICD-10-CM | POA: Insufficient documentation

## 2012-06-28 DIAGNOSIS — F329 Major depressive disorder, single episode, unspecified: Secondary | ICD-10-CM | POA: Insufficient documentation

## 2012-06-28 DIAGNOSIS — F17200 Nicotine dependence, unspecified, uncomplicated: Secondary | ICD-10-CM | POA: Insufficient documentation

## 2012-06-28 DIAGNOSIS — L0390 Cellulitis, unspecified: Secondary | ICD-10-CM

## 2012-06-28 DIAGNOSIS — Z79899 Other long term (current) drug therapy: Secondary | ICD-10-CM | POA: Insufficient documentation

## 2012-06-28 DIAGNOSIS — IMO0002 Reserved for concepts with insufficient information to code with codable children: Secondary | ICD-10-CM | POA: Insufficient documentation

## 2012-06-28 LAB — URINALYSIS, ROUTINE W REFLEX MICROSCOPIC
Glucose, UA: NEGATIVE mg/dL
Hgb urine dipstick: NEGATIVE
Specific Gravity, Urine: 1.015 (ref 1.005–1.030)

## 2012-06-28 MED ORDER — OXYCODONE-ACETAMINOPHEN 5-325 MG PO TABS
2.0000 | ORAL_TABLET | Freq: Once | ORAL | Status: AC
  Administered 2012-06-28: 2 via ORAL
  Filled 2012-06-28: qty 2

## 2012-06-28 MED ORDER — DOXYCYCLINE HYCLATE 100 MG PO TABS
100.0000 mg | ORAL_TABLET | Freq: Once | ORAL | Status: AC
  Administered 2012-06-28: 100 mg via ORAL
  Filled 2012-06-28: qty 1

## 2012-06-28 MED ORDER — CLOTRIMAZOLE 1 % EX CREA: TOPICAL_CREAM | CUTANEOUS | Status: DC

## 2012-06-28 MED ORDER — LIDOCAINE-EPINEPHRINE (PF) 1 %-1:200000 IJ SOLN
INTRAMUSCULAR | Status: AC
  Filled 2012-06-28: qty 10

## 2012-06-28 MED ORDER — CEPHALEXIN 500 MG PO CAPS
500.0000 mg | ORAL_CAPSULE | Freq: Once | ORAL | Status: AC
Start: 2012-06-28 — End: 2012-06-28
  Administered 2012-06-28: 500 mg via ORAL
  Filled 2012-06-28: qty 1

## 2012-06-28 MED ORDER — LIDOCAINE-EPINEPHRINE (PF) 1 %-1:200000 IJ SOLN
20.0000 mL | Freq: Once | INTRAMUSCULAR | Status: AC
  Administered 2012-06-28: 20 mL

## 2012-06-28 MED ORDER — OXYCODONE-ACETAMINOPHEN 5-325 MG PO TABS
2.0000 | ORAL_TABLET | ORAL | Status: AC | PRN
Start: 2012-06-28 — End: 2012-07-08

## 2012-06-28 MED ORDER — CEPHALEXIN 500 MG PO CAPS: 500.0000 mg | ORAL_CAPSULE | Freq: Four times a day (QID) | ORAL | Status: AC

## 2012-06-28 MED ORDER — DOXYCYCLINE HYCLATE 100 MG PO CAPS: 100.0000 mg | ORAL_CAPSULE | Freq: Two times a day (BID) | ORAL | Status: AC

## 2012-06-28 NOTE — ED Notes (Signed)
Pt c/o groin/testicular pain x2days. Pt has redness and swelling between scrotum and left thigh. Pt reports clear drainage from area. Pt states pain is worse with any movement and area is sensitive to touch.

## 2012-06-28 NOTE — ED Notes (Signed)
Pt says he has swelling near scrotom, painful "knot".  Present for 2 days.

## 2012-06-28 NOTE — ED Provider Notes (Signed)
History   This chart was scribed for Glynn Octave, MD by Charolett Bumpers . The patient was seen in room APA11/AP[facility]11. Patient's care was started at 1137.    CSN: [id number]  Arrival date & time 06/28/12  1115   First MD Initiated Contact with Patient 06/28/12 1137      Chief Complaint  Patient presents with  . Testicle Pain    (Consider location/radiation/quality/duration/timing/severity/associated sxs/prior treatment) HPI Jeffrey Wilcox is a 46 y.o. male who presents to the Emergency Department complaining of constant, moderate, worsening redness with associated pain located in his groin area bilaterally over the past 2 days. Pt also reports an abscess located on his left groin area. Pt denies any insect bites or cuts prior to the abscess. Pt denies any fevers. Pt reports associated drainage from the abscess. Pt denies any bleeding. Pt denies any adominal pain, chest pain, back pain or SOB. Pt reports a h/o epilepsy and an inguinal hernia repair in 01/06/2012   Past Medical History  Diagnosis Date  . Degenerative disc disease   . Severe depression   . Arthritis, rheumatoid   . Degenerative disc disease   . Seizures     last seizure was last week-usually happen about once a month  . Anxiety     Past Surgical History  Procedure Date  . Tonsillectomy   . Multiple tooth extractions   . Inguinal hernia repair 01/06/2012    Procedure: HERNIA REPAIR INGUINAL ADULT;  Surgeon: Dalia Heading, MD;  Location: [facility];  Service: General;  Laterality: Right;    Family History  Problem Relation Age of Onset  . Adopted: Yes    History  Substance Use Topics  . Smoking status: Current Everyday Smoker -- 0.5 packs/day for 20 years    Types: Cigarettes  . Smokeless tobacco: Former Neurosurgeon  . Alcohol Use: No     recovering alcoholic for 12 years      Review of Systems A complete 10 system review of systems was obtained and all systems are negative except as noted in the HPI  and PMH.   Allergies  Other and Penicillins  Home Medications   Current Outpatient Rx  Name Route Sig Dispense Refill  . VITAMIN D 1000 UNITS PO TABS Oral Take 1,000 Units by mouth daily.    Marland Kitchen CLONAZEPAM 1 MG PO TABS Oral Take 1 mg by mouth 2 (two) times daily as needed. Anxiety    . CYCLOBENZAPRINE HCL 10 MG PO TABS Oral Take 10 mg by mouth 3 (three) times daily as needed. Muscle Spasms.    . ESCITALOPRAM OXALATE 20 MG PO TABS Oral Take 20 mg by mouth every morning.     . IBUPROFEN 800 MG PO TABS Oral Take 800 mg by mouth 2 (two) times daily as needed. For pain     . LAMOTRIGINE ER 200 MG PO TB24 Oral Take 1 tablet by mouth at bedtime.     Marland Kitchen LAMOTRIGINE ER 50 MG PO TB24 Oral Take 50 mg by mouth daily.    Marland Kitchen PHENYTOIN SODIUM EXTENDED 300 MG PO CAPS Oral Take 300 mg by mouth 2 (two) times daily.     . SUMATRIPTAN SUCCINATE 100 MG PO TABS Oral Take 100 mg by mouth every 2 (two) hours as needed. Migraine    . TAMSULOSIN HCL 0.4 MG PO CAPS Oral Take 0.4 mg by mouth daily.    . CEPHALEXIN 500 MG PO CAPS Oral Take 1 capsule (500 mg total)  by mouth 4 (four) times daily. 40 capsule 0  . CLOTRIMAZOLE 1 % EX CREA  Apply to affected area 2 times daily 15 g 0  . DOXYCYCLINE HYCLATE 100 MG PO CAPS Oral Take 1 capsule (100 mg total) by mouth 2 (two) times daily. 20 capsule 0  . OXYCODONE-ACETAMINOPHEN 5-325 MG PO TABS Oral Take 2 tablets by mouth every 4 (four) hours as needed for pain. 15 tablet 0    BP 121/80  Pulse 81  Temp 98.3 F (36.8 C) (Oral)  Resp 18  Ht 5\' 11"  (1.803 m)  Wt 205 lb (92.987 kg)  BMI 28.59 kg/m2  SpO2 99%  Physical Exam  Nursing note and vitals reviewed. Constitutional: He is oriented to person, place, and time. He appears well-developed and well-nourished. No distress.  HENT:  Head: Normocephalic and atraumatic.  Right Ear: External ear normal.  Left Ear: External ear normal.  Nose: Nose normal.  Eyes: EOM are normal. Pupils are equal, round, and reactive to  light.  Neck: Normal range of motion. Neck supple. No tracheal deviation present.  Cardiovascular: Normal rate, regular rhythm and normal heart sounds.   Pulmonary/Chest: Effort normal and breath sounds normal. No respiratory distress. He has no wheezes.  Abdominal: Soft. He exhibits no distension. There is no tenderness.  Genitourinary: Testes normal. Right testis shows no tenderness. Left testis shows no tenderness.       Bilaterally scaly, erythematous, hyperpigmentation of inner thighs consistent with tinea cruris. Left testicle non-tender, epididymis non-tender. Left groin erythematous and induration present. Inferiorly 0.5 tender nodule with overlaying excoriations. Induration to left medial upper thigh. Bedside U/S shows no fluid collection in upper thigh or scrotum.   Musculoskeletal: Normal range of motion. He exhibits no edema.  Neurological: He is alert and oriented to person, place, and time. No sensory deficit.  Skin: Skin is warm and dry. There is erythema.          Psychiatric: He has a normal mood and affect. His behavior is normal.    ED Course  INCISION AND DRAINAGE Date/Time: 06/28/2012 12:25 PM Performed by: Glynn Octave Authorized by: Glynn Octave Consent: Verbal consent obtained. Risks and benefits: risks, benefits and alternatives were discussed Consent given by: patient Patient understanding: patient states understanding of the procedure being performed Patient identity confirmed: verbally with patient Time out: Immediately prior to procedure a "time out" was called to verify the correct patient, procedure, equipment, support staff and site/side marked as required. Type: abscess Body area: anogenital Location details: perineum Anesthesia: local infiltration Local anesthetic: lidocaine 1% with epinephrine Anesthetic total: 4 ml Patient sedated: no Scalpel size: 11 Needle gauge: 20 Incision type: single straight Complexity: simple Drainage:  purulent Drainage amount: copious Wound treatment: wound left open Packing material: 1/4 in iodoform gauze Patient tolerance: Patient tolerated the procedure well with no immediate complications.   (including critical care time)  DIAGNOSTIC STUDIES: Oxygen Saturation is 99% on room air, normal by my interpretation.    COORDINATION OF CARE:  11:43-Discussed planned course of treatment with the patient including I & D, who is agreeable at this time.   11:50-Preformed U/S of scrotum. Bedside U/S shows no fluid collection in upper thigh or scrotum.   12:00-Medication Orders: Cephalexin (Keflex) capsule 500 mg-once; Doxycycline (Vibra-Tabs) tablet 100 mg-once; Lidocaine-epinephrine (Xylocaine-epinephrine) 1%-1:200000 (with pres) injection 20 mL-once; Oxycodone-acetaminophen (Percocet/Roxicet) 5-325 mg per tablet 2 tablet-once.    Labs Reviewed  URINALYSIS, ROUTINE W REFLEX MICROSCOPIC   No results found.   1.  Abscess and cellulitis   2. Tinea cruris       MDM  Erythema and swelling to left groin x2 days. No testicular pain, abdominal pain, fever vomiting. Bedside ultrasound shows no fluid collection in the proximal thigh or scrotal wall.   I&D performed of subcutaneous abscess. No evidence of Fourneir's gangrene or scrotal wall involvement.  Antibiotics, antifungal cream, wound check in 2 days  I personally performed the services described in this documentation, which was scribed in my presence.  The recorded information has been reviewed and considered.      Glynn Octave, MD 06/28/12 1327

## 2012-10-28 ENCOUNTER — Ambulatory Visit: Payer: PRIVATE HEALTH INSURANCE | Admitting: Urology

## 2012-12-28 ENCOUNTER — Emergency Department (HOSPITAL_COMMUNITY)
Admission: EM | Admit: 2012-12-28 | Discharge: 2012-12-28 | Disposition: A | Discharge status: Home / Self Care | Payer: PRIVATE HEALTH INSURANCE | Attending: Emergency Medicine | Admitting: Emergency Medicine

## 2012-12-28 ENCOUNTER — Encounter (HOSPITAL_COMMUNITY): Payer: Self-pay | Admitting: *Deleted

## 2012-12-28 DIAGNOSIS — F411 Generalized anxiety disorder: Secondary | ICD-10-CM | POA: Insufficient documentation

## 2012-12-28 DIAGNOSIS — Z8739 Personal history of other diseases of the musculoskeletal system and connective tissue: Secondary | ICD-10-CM | POA: Insufficient documentation

## 2012-12-28 DIAGNOSIS — G40909 Epilepsy, unspecified, not intractable, without status epilepticus: Secondary | ICD-10-CM | POA: Insufficient documentation

## 2012-12-28 DIAGNOSIS — Z79899 Other long term (current) drug therapy: Secondary | ICD-10-CM | POA: Insufficient documentation

## 2012-12-28 DIAGNOSIS — F17200 Nicotine dependence, unspecified, uncomplicated: Secondary | ICD-10-CM | POA: Insufficient documentation

## 2012-12-28 DIAGNOSIS — F3289 Other specified depressive episodes: Secondary | ICD-10-CM | POA: Insufficient documentation

## 2012-12-28 DIAGNOSIS — R569 Unspecified convulsions: Secondary | ICD-10-CM

## 2012-12-28 LAB — URINALYSIS, ROUTINE W REFLEX MICROSCOPIC
Glucose, UA: NEGATIVE mg/dL
Leukocytes, UA: NEGATIVE
Nitrite: NEGATIVE
Protein, ur: NEGATIVE mg/dL
Urobilinogen, UA: 0.2 mg/dL (ref 0.0–1.0)

## 2012-12-28 LAB — RAPID URINE DRUG SCREEN, HOSP PERFORMED
Amphetamines: NOT DETECTED
Opiates: NOT DETECTED

## 2012-12-28 LAB — CBC WITH DIFFERENTIAL/PLATELET
Eosinophils Absolute: 0.2 10*3/uL (ref 0.0–0.7)
Lymphocytes Relative: 39 % (ref 12–46)
Lymphs Abs: 2.3 10*3/uL (ref 0.7–4.0)
MCH: 32.2 pg (ref 26.0–34.0)
Neutrophils Relative %: 52 % (ref 43–77)
Platelets: 137 10*3/uL — ABNORMAL LOW (ref 150–400)
RBC: 5.47 MIL/uL (ref 4.22–5.81)
WBC: 5.8 10*3/uL (ref 4.0–10.5)

## 2012-12-28 LAB — BASIC METABOLIC PANEL
GFR calc non Af Amer: >90 mL/min (ref >90)
Glucose, Bld: 94 mg/dL (ref 70–99)
Potassium: 5 mEq/L (ref 3.5–5.1)
Sodium: 138 mEq/L (ref 135–145)

## 2012-12-28 LAB — GLUCOSE, CAPILLARY: Glucose-Capillary: 94 mg/dL (ref 70–99)

## 2012-12-28 MED ORDER — HYDROCODONE-ACETAMINOPHEN 5-325 MG PO TABS
1.5000 | ORAL_TABLET | Freq: Four times a day (QID) | ORAL | Status: DC | PRN
  Administered 2012-12-28: 1.5 via ORAL

## 2012-12-28 MED ORDER — HYDROCODONE-ACETAMINOPHEN 5-325 MG PO TABS
ORAL_TABLET | ORAL | Status: AC
  Filled 2012-12-28: qty 2

## 2012-12-28 MED ORDER — HYDROCODONE-ACETAMINOPHEN 7.5-325 MG PO TABS: 1.0000 | ORAL_TABLET | Freq: Four times a day (QID) | ORAL | Status: DC | PRN

## 2012-12-28 NOTE — ED Notes (Signed)
Patient would like some ice chips. RN made aware. Patient informed that he could not have ice chips till seen by the MD.

## 2012-12-28 NOTE — ED Provider Notes (Signed)
History    This chart was scribed for Carleene Cooper III, MD by Sofie Rower, ED Scribe. The patient was seen in room APA09/APA09 and the patient's care was started at 2:16PM.    CSN: [id number]  Arrival date & time 12/28/12  1238   First MD Initiated Contact with Patient 12/28/12 1416      Chief Complaint  Patient presents with  . Seizures    (Consider location/radiation/quality/duration/timing/severity/associated sxs/prior treatment) The history is provided by the patient. No language interpreter was used.   Jeffrey Wilcox is a 47 y.o. male , arriving via EMS (C-collar and LSB applied PTA) with a hx of epileptic seizures, DDD, and anxiety, who presents to the Emergency Department complaining of sudden, progressively improving, seizures (X 2), onset today (12/28/12).  Associated symptoms include diffuse soreness across the entire body post ictally. EMS reports the pt was found face down, having possibly experienced a seizure. Bystanders report that the pt experienced seizure activity (X 2, unknown duration) prior to arrival of EMS. The seizures did not continue on arrival at [facility] this afternoon. There was no bladder/ bowel incontinence. The pt did not bite his tongue nor was there any head trauma. The pt reports he usually experiences a seizure once a month at baseline, however, today's seizures are more severe than normal. The pt informs he is currently taking Lamictal (50mg  in the AM, 200 mg in the PM) and dilantin (300 mg X 2 per day) which provides mild relief of his seizure activity.  The pt is a current everyday smoker, however, he does not drink alcohol.   PCP is Dr. Loney Hering.    Past Medical History  Diagnosis Date  . Degenerative disc disease   . Severe depression   . Arthritis, rheumatoid   . Degenerative disc disease   . Seizures     last seizure was last week-usually happen about once a month  . Anxiety     Past Surgical History  Procedure Laterality Date  . Tonsillectomy     . Multiple tooth extractions    . Inguinal hernia repair  01/06/2012    Procedure: HERNIA REPAIR INGUINAL ADULT;  Surgeon: Dalia Heading, MD;  Location: [facility];  Service: General;  Laterality: Right;    Family History  Problem Relation Age of Onset  . Adopted: Yes    History  Substance Use Topics  . Smoking status: Current Every Day Smoker -- 0.50 packs/day for 20 years    Types: Cigarettes  . Smokeless tobacco: Former Neurosurgeon  . Alcohol Use: No     Comment: recovering alcoholic for 12 years      Review of Systems  All other systems reviewed and are negative.    Allergies  Other and Penicillins  Home Medications   Current Outpatient Rx  Name  Route  Sig  Dispense  Refill  . cholecalciferol (VITAMIN D) 1000 UNITS tablet   Oral   Take 1,000 Units by mouth daily.         . clonazePAM (KLONOPIN) 1 MG tablet   Oral   Take 1-3 mg by mouth 4 (four) times daily as needed for anxiety. 1 three times daily and 3 at bedtime.         . cyclobenzaprine (FLEXERIL) 10 MG tablet   Oral   Take 10 mg by mouth 3 (three) times daily as needed. Muscle Spasms.         Marland Kitchen escitalopram (LEXAPRO) 20 MG tablet   Oral  Take 20 mg by mouth every morning.          . LamoTRIgine (LAMICTAL XR) 200 MG TB24   Oral   Take 1 tablet by mouth at bedtime.          . LamoTRIgine (LAMICTAL XR) 50 MG TB24   Oral   Take 50 mg by mouth daily.         . phenytoin (DILANTIN) 300 MG ER capsule   Oral   Take 300 mg by mouth 2 (two) times daily.          . simvastatin (ZOCOR) 20 MG tablet   Oral   Take 20 mg by mouth daily.         . SUMAtriptan (IMITREX) 100 MG tablet   Oral   Take 100 mg by mouth every 2 (two) hours as needed. Migraine         . Tamsulosin HCl (FLOMAX) 0.4 MG CAPS   Oral   Take 0.4 mg by mouth daily.           BP 132/108  Pulse 73  Temp(Src) 97.5 F (36.4 C) (Oral)  Resp 18  SpO2 96%  Physical Exam  Nursing note and vitals  reviewed. Constitutional: He is oriented to person, place, and time. He appears well-developed and well-nourished. No distress.  HENT:  Head: Normocephalic and atraumatic.  Poor dentition.   Eyes: EOM are normal. Pupils are equal, round, and reactive to light.  Neck: Neck supple. No tracheal deviation present.  Cardiovascular: Normal rate, regular rhythm and normal heart sounds.   Pulmonary/Chest: Effort normal and breath sounds normal. No respiratory distress. He has no wheezes.  Abdominal: Soft. He exhibits no distension.  Musculoskeletal: Normal range of motion. He exhibits no edema.  Neurological: He is alert and oriented to person, place, and time. No sensory deficit.  Skin: Skin is warm and dry.  Psychiatric: He has a normal mood and affect. His behavior is normal.    ED Course  Procedures (including critical care time)  DIAGNOSTIC STUDIES: Oxygen Saturation is 96% on room air, normal by my interpretation.    COORDINATION OF CARE:   2:25 PM- Treatment plan discussed with patient. Pt agrees with treatment.   Results for orders placed during the hospital encounter of 12/28/12  CBC WITH DIFFERENTIAL      Result Value Range   WBC 5.8  4.0 - 10.5 K/uL   RBC 5.47  4.22 - 5.81 MIL/uL   Hemoglobin 17.6 (*) 13.0 - 17.0 g/dL   HCT 82.9  56.2 - 13.0 %   MCV 93.6  78.0 - 100.0 fL   MCH 32.2  26.0 - 34.0 pg   MCHC 34.4  30.0 - 36.0 g/dL   RDW 86.5  78.4 - 69.6 %   Platelets 137 (*) 150 - 400 K/uL   Neutrophils Relative 52  43 - 77 %   Neutro Abs 3.0  1.7 - 7.7 K/uL   Lymphocytes Relative 39  12 - 46 %   Lymphs Abs 2.3  0.7 - 4.0 K/uL   Monocytes Relative 6  3 - 12 %   Monocytes Absolute 0.4  0.1 - 1.0 K/uL   Eosinophils Relative 3  0 - 5 %   Eosinophils Absolute 0.2  0.0 - 0.7 K/uL   Basophils Relative 0  0 - 1 %   Basophils Absolute 0.0  0.0 - 0.1 K/uL  BASIC METABOLIC PANEL      Result Value Range  Sodium 138  135 - 145 mEq/L   Potassium 5.0  3.5 - 5.1 mEq/L    Chloride 103  96 - 112 mEq/L   CO2 28  19 - 32 mEq/L   Glucose, Bld 94  70 - 99 mg/dL   BUN 8  6 - 23 mg/dL   Creatinine, Ser 6.96  0.50 - 1.35 mg/dL   Calcium 9.6  8.4 - 29.5 mg/dL   GFR calc non Af Amer >90  >90 mL/min   GFR calc Af Amer >90  >90 mL/min  GLUCOSE, CAPILLARY      Result Value Range   Glucose-Capillary 94  70 - 99 mg/dL  URINALYSIS, ROUTINE W REFLEX MICROSCOPIC      Result Value Range   Color, Urine YELLOW  YELLOW   APPearance HAZY (*) CLEAR   Specific Gravity, Urine <1.005 (*) 1.005 - 1.030   pH 6.0  5.0 - 8.0   Glucose, UA NEGATIVE  NEGATIVE mg/dL   Hgb urine dipstick NEGATIVE  NEGATIVE   Bilirubin Urine NEGATIVE  NEGATIVE   Ketones, ur NEGATIVE  NEGATIVE mg/dL   Protein, ur NEGATIVE  NEGATIVE mg/dL   Urobilinogen, UA 0.2  0.0 - 1.0 mg/dL   Nitrite NEGATIVE  NEGATIVE   Leukocytes, UA NEGATIVE  NEGATIVE  URINE RAPID DRUG SCREEN (HOSP PERFORMED)      Result Value Range   Opiates NONE DETECTED  NONE DETECTED   Cocaine NONE DETECTED  NONE DETECTED   Benzodiazepines NONE DETECTED  NONE DETECTED   Amphetamines NONE DETECTED  NONE DETECTED   Tetrahydrocannabinol POSITIVE (*) NONE DETECTED   Barbiturates NONE DETECTED  NONE DETECTED       3:33 PM  Date: 12/28/2012  Rate: 72  Rhythm: normal sinus rhythm  QRS Axis: normal  Intervals: normal  ST/T Wave abnormalities: normal  Conduction Disutrbances:none  Narrative Interpretation: Normal EKG  Old EKG Reviewed: unchanged  Pt had a seizure, is on seizure medications, and has recently had Dilantin level checked by Dr. Gerilyn Pilgrim.  Advised to continue his regular medications.    Carleene Cooper III, MD    1. Seizure      I personally performed the services described in this documentation, which was scribed in my presence. The recorded information has been reviewed and is accurate. Osvaldo Human, MD      Carleene Cooper III, MD 12/28/12 [phone]

## 2012-12-28 NOTE — ED Notes (Signed)
Reported pt found face down for possible seizure, witnessed seizure activity x 2 per report, c-collar applied, pt combative for long spine board

## 2012-12-29 ENCOUNTER — Encounter (HOSPITAL_COMMUNITY): Payer: Self-pay | Admitting: Emergency Medicine

## 2012-12-29 ENCOUNTER — Emergency Department (HOSPITAL_COMMUNITY)
Admission: EM | Admit: 2012-12-29 | Discharge: 2012-12-29 | Disposition: A | Discharge status: Home / Self Care | Payer: PRIVATE HEALTH INSURANCE | Attending: Emergency Medicine | Admitting: Emergency Medicine

## 2012-12-29 ENCOUNTER — Emergency Department (HOSPITAL_COMMUNITY): Payer: PRIVATE HEALTH INSURANCE

## 2012-12-29 DIAGNOSIS — Z79899 Other long term (current) drug therapy: Secondary | ICD-10-CM | POA: Insufficient documentation

## 2012-12-29 DIAGNOSIS — F3289 Other specified depressive episodes: Secondary | ICD-10-CM | POA: Insufficient documentation

## 2012-12-29 DIAGNOSIS — H538 Other visual disturbances: Secondary | ICD-10-CM | POA: Insufficient documentation

## 2012-12-29 DIAGNOSIS — Z8739 Personal history of other diseases of the musculoskeletal system and connective tissue: Secondary | ICD-10-CM | POA: Insufficient documentation

## 2012-12-29 DIAGNOSIS — G40909 Epilepsy, unspecified, not intractable, without status epilepticus: Secondary | ICD-10-CM | POA: Insufficient documentation

## 2012-12-29 DIAGNOSIS — F17200 Nicotine dependence, unspecified, uncomplicated: Secondary | ICD-10-CM | POA: Insufficient documentation

## 2012-12-29 DIAGNOSIS — F411 Generalized anxiety disorder: Secondary | ICD-10-CM | POA: Insufficient documentation

## 2012-12-29 DIAGNOSIS — R42 Dizziness and giddiness: Secondary | ICD-10-CM | POA: Insufficient documentation

## 2012-12-29 LAB — CBC WITH DIFFERENTIAL/PLATELET
Eosinophils Relative: 2 % (ref 0–5)
HCT: 50.2 % (ref 39.0–52.0)
Hemoglobin: 17.7 g/dL — ABNORMAL HIGH (ref 13.0–17.0)
Lymphocytes Relative: 31 % (ref 12–46)
Lymphs Abs: 2.2 10*3/uL (ref 0.7–4.0)
MCV: 92.1 fL (ref 78.0–100.0)
Monocytes Absolute: 0.5 10*3/uL (ref 0.1–1.0)
RBC: 5.45 MIL/uL (ref 4.22–5.81)
WBC: 7.3 10*3/uL (ref 4.0–10.5)

## 2012-12-29 LAB — BASIC METABOLIC PANEL
CO2: 26 mEq/L (ref 19–32)
Calcium: 9.5 mg/dL (ref 8.4–10.5)
Chloride: 102 mEq/L (ref 96–112)
Creatinine, Ser: 0.79 mg/dL (ref 0.50–1.35)
Glucose, Bld: 91 mg/dL (ref 70–99)

## 2012-12-29 MED ORDER — LORAZEPAM 2 MG/ML IJ SOLN
1.0000 mg | Freq: Once | INTRAMUSCULAR | Status: AC
  Administered 2012-12-29: 1 mg via INTRAVENOUS
  Filled 2012-12-29: qty 1

## 2012-12-29 MED ORDER — ONDANSETRON HCL 4 MG/2ML IJ SOLN
4.0000 mg | Freq: Once | INTRAMUSCULAR | Status: AC
  Administered 2012-12-29: 4 mg via INTRAVENOUS
  Filled 2012-12-29: qty 2

## 2012-12-29 MED ORDER — MECLIZINE HCL 50 MG PO TABS: 50.0000 mg | ORAL_TABLET | Freq: Three times a day (TID) | ORAL | Status: DC | PRN

## 2012-12-29 MED ORDER — FENTANYL CITRATE 0.05 MG/ML IJ SOLN
50.0000 ug | Freq: Once | INTRAMUSCULAR | Status: AC
  Administered 2012-12-29: 50 ug via INTRAVENOUS
  Filled 2012-12-29: qty 2

## 2012-12-29 MED ORDER — SODIUM CHLORIDE 0.9 % IV BOLUS (SEPSIS)
1000.0000 mL | Freq: Once | INTRAVENOUS | Status: AC
  Administered 2012-12-29: 1000 mL via INTRAVENOUS

## 2012-12-29 NOTE — ED Provider Notes (Signed)
History  This chart was scribed for Jeffrey Hutching, MD, by Candelaria Stagers, ED Scribe. This patient was seen in room APA11/APA11 and the patient's care was started at 1:33 PM   CSN: [id number]  Arrival date & time 12/29/12  1124   First MD Initiated Contact with Patient 12/29/12 1307      Chief Complaint  Patient presents with  . Dizziness     The history is provided by the patient. No language interpreter was used.   Jeffrey Wilcox is a 47 y.o. male who presents to the Emergency Department complaining of dizziness and blurred vision that started around 7pm last night.  Pt has h/o epilepsy which was diagnosed at age 5.  Pt had a seizure yesterday and was BIBA to the ED.  He began experiencing dizziness and blurred vision after returning home and reports he called his PCP, Dr. Loney Hering, who advised to come to ED.  Pt has home health for epilepsy, which drove him to the ED today.  Past Medical History  Diagnosis Date  . Degenerative disc disease   . Severe depression   . Arthritis, rheumatoid   . Degenerative disc disease   . Seizures     last seizure was last week-usually happen about once a month  . Anxiety     Past Surgical History  Procedure Laterality Date  . Tonsillectomy    . Multiple tooth extractions    . Inguinal hernia repair  01/06/2012    Procedure: HERNIA REPAIR INGUINAL ADULT;  Surgeon: Dalia Heading, MD;  Location: [facility];  Service: General;  Laterality: Right;    Family History  Problem Relation Age of Onset  . Adopted: Yes    History  Substance Use Topics  . Smoking status: Current Every Day Smoker -- 0.50 packs/day for 20 years    Types: Cigarettes  . Smokeless tobacco: Former Neurosurgeon  . Alcohol Use: No     Comment: recovering alcoholic for 12 years      Review of Systems  Eyes: Positive for visual disturbance (blurred vision).  Neurological: Positive for dizziness and seizures.  Psychiatric/Behavioral: Negative for confusion.  All other systems  reviewed and are negative.    Allergies  Other and Penicillins  Home Medications   Current Outpatient Rx  Name  Route  Sig  Dispense  Refill  . cholecalciferol (VITAMIN D) 1000 UNITS tablet   Oral   Take 1,000 Units by mouth daily.         . clonazePAM (KLONOPIN) 1 MG tablet   Oral   Take 1-3 mg by mouth 4 (four) times daily as needed for anxiety. 1 three times daily and 3 at bedtime.         . cyclobenzaprine (FLEXERIL) 10 MG tablet   Oral   Take 10 mg by mouth 3 (three) times daily as needed. Muscle Spasms.         Marland Kitchen escitalopram (LEXAPRO) 20 MG tablet   Oral   Take 20 mg by mouth every morning.          . LamoTRIgine (LAMICTAL XR) 200 MG TB24   Oral   Take 1 tablet by mouth at bedtime.          . LamoTRIgine (LAMICTAL XR) 50 MG TB24   Oral   Take 50 mg by mouth daily.         . phenytoin (DILANTIN) 300 MG ER capsule   Oral   Take 300 mg by mouth 2 (  two) times daily.          . simvastatin (ZOCOR) 20 MG tablet   Oral   Take 20 mg by mouth daily.         . SUMAtriptan (IMITREX) 100 MG tablet   Oral   Take 100 mg by mouth every 2 (two) hours as needed. Migraine         . Tamsulosin HCl (FLOMAX) 0.4 MG CAPS   Oral   Take 0.4 mg by mouth daily.           BP 126/79  Pulse 74  Temp(Src) 97.9 F (36.6 C) (Oral)  Resp 21  SpO2 100%  Physical Exam  Nursing note and vitals reviewed. Constitutional: He is oriented to person, place, and time. He appears well-developed and well-nourished. No distress.  HENT:  Head: Normocephalic and atraumatic.  Eyes: Conjunctivae and EOM are normal. Pupils are equal, round, and reactive to light.  Neck: Neck supple. No tracheal deviation present.  Cardiovascular: Normal rate.   Pulmonary/Chest: Effort normal. No respiratory distress.  Abdominal: He exhibits no distension.  Musculoskeletal: Normal range of motion. He exhibits no tenderness.  Moving all extremities equally.   Neurological: He is alert  and oriented to person, place, and time. No sensory deficit.  Skin: Skin is dry.  Psychiatric: He has a normal mood and affect. His behavior is normal.    ED Course  Procedures   DIAGNOSTIC STUDIES: Oxygen Saturation is 100% on room air, normal by my interpretation.    COORDINATION OF CARE:  1:37 PM Will give IV fluids and ativan.  Pt understands and agrees.     Labs Reviewed  CBC WITH DIFFERENTIAL - Abnormal; Notable for the following:    Hemoglobin 17.7 (*)    Platelets 149 (*)    All other components within normal limits  GLUCOSE, CAPILLARY  BASIC METABOLIC PANEL   Ct Head Wo Contrast  12/29/2012  *RADIOLOGY REPORT*  Clinical Data: Seizure.  Left-sided weakness  CT HEAD WITHOUT CONTRAST  Technique:  Contiguous axial images were obtained from the base of the skull through the vertex without contrast.  Comparison: CT 01/26/2011  Findings: Mild atrophy.  Ventricle size is normal.  Subtle hypodensity left pons is probably artifact however could be acute infarct.  The patient has  left-sided weakness and this is therefore probably artifact.  Negative for hemorrhage or mass.  Benign cyst in the right basal ganglia is unchanged.  Calvarium is intact.  IMPRESSION: Mild atrophy.  Subtle hypodensity left pons is probably artifactual however could be acute infarct.  Correlate with symptoms.   Original Report Authenticated By: Janeece Riggers, M.D.      No diagnosis found.    MDM  Patient feeling much better after IV fluids.  He is ambulatory without neuro deficits. Discharge home on Antivert 50 mg #20.      I personally performed the services described in this documentation, which was scribed in my presence. The recorded information has been reviewed and is accurate.         Jeffrey Hutching, MD 12/29/12 1723

## 2012-12-29 NOTE — ED Notes (Signed)
Pt here yesterday for seizures. Back today states pcp sent  Pt back here due to dizziness/blurred vision/dbl vision. States did not have these sx's yesterday. nad at this time. Alert/oreinted.

## 2012-12-29 NOTE — ED Notes (Signed)
Per verbal order from EDP, pt is to be ambulated. Pt ambulated down hallway in front of room 11. Pt walked with a steady gait to the end of the hall. No dyspnea, dizziness, noted upon ambulation.

## 2013-10-06 ENCOUNTER — Emergency Department (HOSPITAL_COMMUNITY)
Admission: EM | Admit: 2013-10-06 | Discharge: 2013-10-06 | Disposition: A | Discharge status: Home / Self Care | Payer: PRIVATE HEALTH INSURANCE | Attending: Emergency Medicine | Admitting: Emergency Medicine

## 2013-10-06 ENCOUNTER — Emergency Department (HOSPITAL_COMMUNITY): Payer: PRIVATE HEALTH INSURANCE

## 2013-10-06 ENCOUNTER — Other Ambulatory Visit: Payer: Self-pay

## 2013-10-06 ENCOUNTER — Encounter (HOSPITAL_COMMUNITY): Payer: Self-pay | Admitting: Emergency Medicine

## 2013-10-06 DIAGNOSIS — Y92009 Unspecified place in unspecified non-institutional (private) residence as the place of occurrence of the external cause: Secondary | ICD-10-CM | POA: Insufficient documentation

## 2013-10-06 DIAGNOSIS — Z8739 Personal history of other diseases of the musculoskeletal system and connective tissue: Secondary | ICD-10-CM | POA: Insufficient documentation

## 2013-10-06 DIAGNOSIS — M25512 Pain in left shoulder: Secondary | ICD-10-CM

## 2013-10-06 DIAGNOSIS — F3289 Other specified depressive episodes: Secondary | ICD-10-CM | POA: Insufficient documentation

## 2013-10-06 DIAGNOSIS — F17200 Nicotine dependence, unspecified, uncomplicated: Secondary | ICD-10-CM | POA: Insufficient documentation

## 2013-10-06 DIAGNOSIS — R569 Unspecified convulsions: Secondary | ICD-10-CM

## 2013-10-06 DIAGNOSIS — G40909 Epilepsy, unspecified, not intractable, without status epilepticus: Secondary | ICD-10-CM | POA: Insufficient documentation

## 2013-10-06 DIAGNOSIS — Y939 Activity, unspecified: Secondary | ICD-10-CM | POA: Insufficient documentation

## 2013-10-06 DIAGNOSIS — R7989 Other specified abnormal findings of blood chemistry: Secondary | ICD-10-CM | POA: Insufficient documentation

## 2013-10-06 DIAGNOSIS — F411 Generalized anxiety disorder: Secondary | ICD-10-CM | POA: Insufficient documentation

## 2013-10-06 DIAGNOSIS — S5000XA Contusion of unspecified elbow, initial encounter: Secondary | ICD-10-CM | POA: Insufficient documentation

## 2013-10-06 DIAGNOSIS — S46909A Unspecified injury of unspecified muscle, fascia and tendon at shoulder and upper arm level, unspecified arm, initial encounter: Secondary | ICD-10-CM | POA: Insufficient documentation

## 2013-10-06 DIAGNOSIS — Z5181 Encounter for therapeutic drug level monitoring: Secondary | ICD-10-CM

## 2013-10-06 DIAGNOSIS — S4980XA Other specified injuries of shoulder and upper arm, unspecified arm, initial encounter: Secondary | ICD-10-CM | POA: Insufficient documentation

## 2013-10-06 DIAGNOSIS — R296 Repeated falls: Secondary | ICD-10-CM | POA: Insufficient documentation

## 2013-10-06 DIAGNOSIS — F329 Major depressive disorder, single episode, unspecified: Secondary | ICD-10-CM | POA: Insufficient documentation

## 2013-10-06 DIAGNOSIS — R7889 Finding of other specified substances, not normally found in blood: Secondary | ICD-10-CM

## 2013-10-06 DIAGNOSIS — S5002XA Contusion of left elbow, initial encounter: Secondary | ICD-10-CM

## 2013-10-06 DIAGNOSIS — Z79899 Other long term (current) drug therapy: Secondary | ICD-10-CM | POA: Insufficient documentation

## 2013-10-06 DIAGNOSIS — Z880 Allergy status to penicillin: Secondary | ICD-10-CM | POA: Insufficient documentation

## 2013-10-06 MED ORDER — PHENYTOIN SODIUM EXTENDED 100 MG PO CAPS
300.0000 mg | ORAL_CAPSULE | Freq: Once | ORAL | Status: AC
  Administered 2013-10-06: 300 mg via ORAL
  Filled 2013-10-06: qty 3

## 2013-10-06 MED ORDER — HYDROCODONE-ACETAMINOPHEN 5-325 MG PO TABS
1.0000 | ORAL_TABLET | Freq: Once | ORAL | Status: AC
  Administered 2013-10-06: 1 via ORAL
  Filled 2013-10-06: qty 1

## 2013-10-06 MED ORDER — IBUPROFEN 400 MG PO TABS
400.0000 mg | ORAL_TABLET | Freq: Once | ORAL | Status: AC
  Administered 2013-10-06: 400 mg via ORAL
  Filled 2013-10-06: qty 1

## 2013-10-06 NOTE — ED Notes (Signed)
Pt states he is in a hurry and cannot wait on discharged papers. Pt walked out of the department.

## 2013-10-06 NOTE — ED Notes (Signed)
Call caregiver Raynelle Fanning [phone] upon DC

## 2013-10-06 NOTE — ED Notes (Signed)
History of seizures, thinks he had a seizure today

## 2013-10-06 NOTE — ED Provider Notes (Signed)
CSN: [id number]     Arrival date & time 10/06/13  1734 History   First MD Initiated Contact with Patient 10/06/13 1830      This chart was scribed for [name], MD by Arlan Organ, ED Scribe. This patient was seen in room APA16A/APA16A and the patient's care was started 7:07 PM.   Chief Complaint  Patient presents with  . Seizures   The history is provided by the patient. No language interpreter was used.    HPI Comments: Jeffrey Wilcox is a 47 y.o. Male with a hx of seizures who presents to the Emergency Department complaining of an episode of a sudden onset seizure that occurred earlier today. He states he was in his home around 3:30 and found himself on the ground around 4:45 with his dogs attempting to wake him by licking his face. Pt feels when he fell, he landed on his left side due to his current location of pain. Pt now c/o of left sided elbow and shoulder pain. He states he experienced urinary incontinence at the time of the presumed seizure. He denies nausea, vomiting, shortness of breath, numbness or tingling of his arms or legs,  CP, back pain, and emesis. Pt reports his last seizure was last month, and he says he typically experiences a seizure about 1-2 a  month. Pt reports taking his medication as prescribed. He states he wouldn't have come in if he hadn't hurt his left shoulder and elbow.   PCP Dr Felecia Shelling Neurologist Dr Gerilyn Pilgrim  Past Medical History  Diagnosis Date  . Degenerative disc disease   . Severe depression   . Arthritis, rheumatoid   . Degenerative disc disease   . Seizures     last seizure was last week-usually happen about once a month  . Anxiety    Past Surgical History  Procedure Laterality Date  . Tonsillectomy    . Multiple tooth extractions    . Inguinal hernia repair  01/06/2012    Procedure: HERNIA REPAIR INGUINAL ADULT;  Surgeon: Dalia Heading, MD;  Location: [facility];  Service: General;  Laterality: Right;   Family History  Problem Relation  Age of Onset  . Adopted: Yes   History  Substance Use Topics  . Smoking status: Current Every Day Smoker -- 0.50 packs/day for 20 years    Types: Cigarettes  . Smokeless tobacco: Former Neurosurgeon  . Alcohol Use: No     Comment: recovering alcoholic for 12 years  lives at home Lives alone with 2 dogs On disability for heart  Review of Systems  Musculoskeletal: Positive for arthralgias (elbow and arm pain).  Neurological: Positive for seizures.  All other systems reviewed and are negative.    Allergies  Other and Penicillins  Home Medications   Current Outpatient Rx  Name  Route  Sig  Dispense  Refill  . cholecalciferol (VITAMIN D) 1000 UNITS tablet   Oral   Take 1,000 Units by mouth daily.         . clonazePAM (KLONOPIN) 1 MG tablet   Oral   Take 1-3 mg by mouth 4 (four) times daily as needed for anxiety. 1 three times daily and 3 at bedtime.         . cyclobenzaprine (FLEXERIL) 10 MG tablet   Oral   Take 10 mg by mouth 3 (three) times daily as needed. Muscle Spasms.         Marland Kitchen escitalopram (LEXAPRO) 20 MG tablet   Oral  Take 20 mg by mouth every morning.          . LamoTRIgine (LAMICTAL XR) 200 MG TB24   Oral   Take 1 tablet by mouth at bedtime.          . LamoTRIgine (LAMICTAL XR) 50 MG TB24   Oral   Take 50 mg by mouth daily.         . meclizine (ANTIVERT) 50 MG tablet   Oral   Take 1 tablet (50 mg total) by mouth 3 (three) times daily as needed.   20 tablet   0   . phenytoin (DILANTIN) 300 MG ER capsule   Oral   Take 300 mg by mouth 2 (two) times daily.          . simvastatin (ZOCOR) 20 MG tablet   Oral   Take 20 mg by mouth daily.         . SUMAtriptan (IMITREX) 100 MG tablet   Oral   Take 100 mg by mouth every 2 (two) hours as needed. Migraine         . Tamsulosin HCl (FLOMAX) 0.4 MG CAPS   Oral   Take 0.4 mg by mouth daily.          Triage Vitals: BP 106/89  Pulse 81  Temp(Src) 98.1 F (36.7 C) (Oral)  Resp 17  Ht 5'  10.5" (1.791 m)  Wt 212 lb (96.163 kg)  BMI 29.98 kg/m2  SpO2 97%  Vital signs normal    Physical Exam  Nursing note and vitals reviewed. Constitutional: He is oriented to person, place, and time. He appears well-developed and well-nourished.  Non-toxic appearance. He does not appear ill. No distress.  HENT:  Head: Normocephalic and atraumatic.  Right Ear: External ear normal.  Left Ear: External ear normal.  Nose: Nose normal. No mucosal edema or rhinorrhea.  Mouth/Throat: Oropharynx is clear and moist and mucous membranes are normal. No dental abscesses or uvula swelling.  No trauma to tongue  Eyes: Conjunctivae and EOM are normal. Pupils are equal, round, and reactive to light.  Neck: Normal range of motion and full passive range of motion without pain. Neck supple.  Cardiovascular: Normal rate, regular rhythm and normal heart sounds.  Exam reveals no gallop and no friction rub.   No murmur heard. Pulmonary/Chest: Effort normal and breath sounds normal. No respiratory distress. He has no wheezes. He has no rhonchi. He has no rales. He exhibits no tenderness and no crepitus.  Ribs non tender  Abdominal: Soft. Normal appearance and bowel sounds are normal. He exhibits no distension. There is no tenderness. There is no rebound and no guarding.  Musculoskeletal: Normal range of motion. He exhibits no edema and no tenderness.    No joint effusion of left elbow Tender on attempt of ROM to left elbow Tender to left lateral clavicle near shoulder joint without crepitance, swelling or deformity. Shoulder is nontender.   Neurological: He is alert and oriented to person, place, and time. He has normal strength. No cranial nerve deficit.  Skin: Skin is warm, dry and intact. No rash noted. No erythema. No pallor.  Psychiatric: He has a normal mood and affect. His speech is normal and behavior is normal. His mood appears not anxious.    ED Course.  Procedures (including critical care  time)  Medications  HYDROcodone-acetaminophen (NORCO/VICODIN) 5-325 MG per tablet 1 tablet (1 tablet Oral Given 10/06/13 1922)  ibuprofen (ADVIL,MOTRIN) tablet 400 mg (400 mg Oral Given  10/06/13 1922)  phenytoin (DILANTIN) ER capsule 300 mg (300 mg Oral Given 10/06/13 2035)     DIAGNOSTIC STUDIES: Oxygen Saturation is 97% on RA, Normal by my interpretation.    COORDINATION OF CARE: 7:19 PM- Will give norco/vicodin and ibuprofen. Will order X-Ray. Discussed treatment plan with pt at bedside and pt agreed to plan.     8:27 PM- Discussed X-Ray results with pt. Advised pt to take OTC medication for pain. Patient states his pain is much improved. He was given a additional dose of Dilantin orally in the ED tonight. His device to take his usual evening dose when he also gets home.  Review of the [place] shows patient gets #120 alprazolam 1 mg tablets monthly, the last filled was November 14. Patient also gets #30 hydrocodone 7.5/325 every month, the last was November 14.  Labs Review Results for orders placed during the hospital encounter of 10/06/13  PHENYTOIN LEVEL, TOTAL      Result Value Range   Phenytoin Lvl 9.4 (*) 10.0 - 20.0 ug/mL   Laboratory interpretation all normal except subtherapeutic Dilantin level  Imaging Review Dg Clavicle Left  10/06/2013   CLINICAL DATA:  Seizures  EXAM: LEFT CLAVICLE - 2+ VIEWS  COMPARISON:  None.  FINDINGS: No acute fracture.  No dislocation.  IMPRESSION: No acute bony pathology.   Electronically Signed   By: Maryclare Bean M.D.   On: 10/06/2013 20:01   Dg Elbow Complete Left  10/06/2013   CLINICAL DATA:  Seizures.  Injury.  EXAM: LEFT ELBOW - COMPLETE 3+ VIEW  COMPARISON:  None.  FINDINGS: No acute fracture. No dislocation. There is spurring in the proximal ulna at the insertion of the triceps tendon.  IMPRESSION: No acute bony pathology.   Electronically Signed   By: Maryclare Bean M.D.   On: 10/06/2013 20:02    EKG Interpretation   None       Date: 10/06/2013  Rate: 87  Rhythm: normal sinus rhythm  QRS Axis: normal  Intervals: normal  ST/T Wave abnormalities: normal  Conduction Disutrbances:none  Narrative Interpretation:   Old EKG Reviewed: unchanged from 12/29/2012    MDM   1. Seizure   2. Subtherapeutic phenytoin level   3. Contusion, elbow, left, initial encounter   4. Shoulder pain, acute, left      Plan discharge  Devoria Albe, MD, FACEP   I personally performed the services described in this documentation, which was scribed in my presence. The recorded information has been reviewed and considered.  Devoria Albe, MD, Armando Gang     [name], MD 10/06/13 [phone]

## 2014-11-09 DIAGNOSIS — F329 Major depressive disorder, single episode, unspecified: Secondary | ICD-10-CM | POA: Diagnosis not present

## 2014-11-09 DIAGNOSIS — R569 Unspecified convulsions: Secondary | ICD-10-CM | POA: Diagnosis not present

## 2014-11-10 DIAGNOSIS — F329 Major depressive disorder, single episode, unspecified: Secondary | ICD-10-CM | POA: Diagnosis not present

## 2014-11-10 DIAGNOSIS — R569 Unspecified convulsions: Secondary | ICD-10-CM | POA: Diagnosis not present

## 2014-11-11 DIAGNOSIS — F329 Major depressive disorder, single episode, unspecified: Secondary | ICD-10-CM | POA: Diagnosis not present

## 2014-11-11 DIAGNOSIS — R569 Unspecified convulsions: Secondary | ICD-10-CM | POA: Diagnosis not present

## 2014-11-19 DIAGNOSIS — F329 Major depressive disorder, single episode, unspecified: Secondary | ICD-10-CM | POA: Diagnosis not present

## 2014-11-19 DIAGNOSIS — R569 Unspecified convulsions: Secondary | ICD-10-CM | POA: Diagnosis not present

## 2014-11-20 DIAGNOSIS — F329 Major depressive disorder, single episode, unspecified: Secondary | ICD-10-CM | POA: Diagnosis not present

## 2014-11-20 DIAGNOSIS — R569 Unspecified convulsions: Secondary | ICD-10-CM | POA: Diagnosis not present

## 2014-11-21 DIAGNOSIS — F329 Major depressive disorder, single episode, unspecified: Secondary | ICD-10-CM | POA: Diagnosis not present

## 2014-11-21 DIAGNOSIS — R569 Unspecified convulsions: Secondary | ICD-10-CM | POA: Diagnosis not present

## 2014-11-22 DIAGNOSIS — R569 Unspecified convulsions: Secondary | ICD-10-CM | POA: Diagnosis not present

## 2014-11-22 DIAGNOSIS — F329 Major depressive disorder, single episode, unspecified: Secondary | ICD-10-CM | POA: Diagnosis not present

## 2014-11-23 DIAGNOSIS — R569 Unspecified convulsions: Secondary | ICD-10-CM | POA: Diagnosis not present

## 2014-11-23 DIAGNOSIS — F329 Major depressive disorder, single episode, unspecified: Secondary | ICD-10-CM | POA: Diagnosis not present

## 2014-11-24 DIAGNOSIS — R569 Unspecified convulsions: Secondary | ICD-10-CM | POA: Diagnosis not present

## 2014-11-24 DIAGNOSIS — F329 Major depressive disorder, single episode, unspecified: Secondary | ICD-10-CM | POA: Diagnosis not present

## 2014-11-25 DIAGNOSIS — R569 Unspecified convulsions: Secondary | ICD-10-CM | POA: Diagnosis not present

## 2014-11-25 DIAGNOSIS — F329 Major depressive disorder, single episode, unspecified: Secondary | ICD-10-CM | POA: Diagnosis not present

## 2014-11-26 DIAGNOSIS — F329 Major depressive disorder, single episode, unspecified: Secondary | ICD-10-CM | POA: Diagnosis not present

## 2014-11-26 DIAGNOSIS — R569 Unspecified convulsions: Secondary | ICD-10-CM | POA: Diagnosis not present

## 2014-11-27 DIAGNOSIS — F329 Major depressive disorder, single episode, unspecified: Secondary | ICD-10-CM | POA: Diagnosis not present

## 2014-11-27 DIAGNOSIS — R569 Unspecified convulsions: Secondary | ICD-10-CM | POA: Diagnosis not present

## 2014-11-28 DIAGNOSIS — R569 Unspecified convulsions: Secondary | ICD-10-CM | POA: Diagnosis not present

## 2014-11-28 DIAGNOSIS — F329 Major depressive disorder, single episode, unspecified: Secondary | ICD-10-CM | POA: Diagnosis not present

## 2014-11-29 DIAGNOSIS — F329 Major depressive disorder, single episode, unspecified: Secondary | ICD-10-CM | POA: Diagnosis not present

## 2014-11-29 DIAGNOSIS — R569 Unspecified convulsions: Secondary | ICD-10-CM | POA: Diagnosis not present

## 2014-11-29 DIAGNOSIS — M25519 Pain in unspecified shoulder: Secondary | ICD-10-CM | POA: Diagnosis not present

## 2014-11-29 DIAGNOSIS — G4459 Other complicated headache syndrome: Secondary | ICD-10-CM | POA: Diagnosis not present

## 2014-11-29 DIAGNOSIS — G40909 Epilepsy, unspecified, not intractable, without status epilepticus: Secondary | ICD-10-CM | POA: Diagnosis not present

## 2014-11-29 DIAGNOSIS — Z79899 Other long term (current) drug therapy: Secondary | ICD-10-CM | POA: Diagnosis not present

## 2014-11-30 DIAGNOSIS — R569 Unspecified convulsions: Secondary | ICD-10-CM | POA: Diagnosis not present

## 2014-11-30 DIAGNOSIS — F329 Major depressive disorder, single episode, unspecified: Secondary | ICD-10-CM | POA: Diagnosis not present

## 2014-12-01 DIAGNOSIS — F329 Major depressive disorder, single episode, unspecified: Secondary | ICD-10-CM | POA: Diagnosis not present

## 2014-12-01 DIAGNOSIS — R569 Unspecified convulsions: Secondary | ICD-10-CM | POA: Diagnosis not present

## 2014-12-02 DIAGNOSIS — F329 Major depressive disorder, single episode, unspecified: Secondary | ICD-10-CM | POA: Diagnosis not present

## 2014-12-02 DIAGNOSIS — R569 Unspecified convulsions: Secondary | ICD-10-CM | POA: Diagnosis not present

## 2014-12-03 DIAGNOSIS — R569 Unspecified convulsions: Secondary | ICD-10-CM | POA: Diagnosis not present

## 2014-12-03 DIAGNOSIS — F329 Major depressive disorder, single episode, unspecified: Secondary | ICD-10-CM | POA: Diagnosis not present

## 2014-12-04 DIAGNOSIS — F329 Major depressive disorder, single episode, unspecified: Secondary | ICD-10-CM | POA: Diagnosis not present

## 2014-12-04 DIAGNOSIS — R569 Unspecified convulsions: Secondary | ICD-10-CM | POA: Diagnosis not present

## 2014-12-05 DIAGNOSIS — F329 Major depressive disorder, single episode, unspecified: Secondary | ICD-10-CM | POA: Diagnosis not present

## 2014-12-05 DIAGNOSIS — R569 Unspecified convulsions: Secondary | ICD-10-CM | POA: Diagnosis not present

## 2014-12-06 DIAGNOSIS — F329 Major depressive disorder, single episode, unspecified: Secondary | ICD-10-CM | POA: Diagnosis not present

## 2014-12-06 DIAGNOSIS — R569 Unspecified convulsions: Secondary | ICD-10-CM | POA: Diagnosis not present

## 2014-12-07 DIAGNOSIS — R569 Unspecified convulsions: Secondary | ICD-10-CM | POA: Diagnosis not present

## 2014-12-07 DIAGNOSIS — F329 Major depressive disorder, single episode, unspecified: Secondary | ICD-10-CM | POA: Diagnosis not present

## 2014-12-08 DIAGNOSIS — R569 Unspecified convulsions: Secondary | ICD-10-CM | POA: Diagnosis not present

## 2014-12-08 DIAGNOSIS — F329 Major depressive disorder, single episode, unspecified: Secondary | ICD-10-CM | POA: Diagnosis not present

## 2014-12-09 DIAGNOSIS — F329 Major depressive disorder, single episode, unspecified: Secondary | ICD-10-CM | POA: Diagnosis not present

## 2014-12-09 DIAGNOSIS — R569 Unspecified convulsions: Secondary | ICD-10-CM | POA: Diagnosis not present

## 2014-12-10 DIAGNOSIS — E538 Deficiency of other specified B group vitamins: Secondary | ICD-10-CM | POA: Diagnosis not present

## 2014-12-10 DIAGNOSIS — R569 Unspecified convulsions: Secondary | ICD-10-CM | POA: Diagnosis not present

## 2014-12-10 DIAGNOSIS — R5383 Other fatigue: Secondary | ICD-10-CM | POA: Diagnosis not present

## 2014-12-10 DIAGNOSIS — M810 Age-related osteoporosis without current pathological fracture: Secondary | ICD-10-CM | POA: Diagnosis not present

## 2014-12-10 DIAGNOSIS — F329 Major depressive disorder, single episode, unspecified: Secondary | ICD-10-CM | POA: Diagnosis not present

## 2014-12-10 DIAGNOSIS — Z79899 Other long term (current) drug therapy: Secondary | ICD-10-CM | POA: Diagnosis not present

## 2014-12-10 DIAGNOSIS — E559 Vitamin D deficiency, unspecified: Secondary | ICD-10-CM | POA: Diagnosis not present

## 2014-12-11 DIAGNOSIS — R569 Unspecified convulsions: Secondary | ICD-10-CM | POA: Diagnosis not present

## 2014-12-11 DIAGNOSIS — F329 Major depressive disorder, single episode, unspecified: Secondary | ICD-10-CM | POA: Diagnosis not present

## 2014-12-12 DIAGNOSIS — R569 Unspecified convulsions: Secondary | ICD-10-CM | POA: Diagnosis not present

## 2014-12-12 DIAGNOSIS — F329 Major depressive disorder, single episode, unspecified: Secondary | ICD-10-CM | POA: Diagnosis not present

## 2014-12-13 DIAGNOSIS — F329 Major depressive disorder, single episode, unspecified: Secondary | ICD-10-CM | POA: Diagnosis not present

## 2014-12-13 DIAGNOSIS — R569 Unspecified convulsions: Secondary | ICD-10-CM | POA: Diagnosis not present

## 2014-12-14 DIAGNOSIS — F329 Major depressive disorder, single episode, unspecified: Secondary | ICD-10-CM | POA: Diagnosis not present

## 2014-12-14 DIAGNOSIS — R569 Unspecified convulsions: Secondary | ICD-10-CM | POA: Diagnosis not present

## 2014-12-15 DIAGNOSIS — R569 Unspecified convulsions: Secondary | ICD-10-CM | POA: Diagnosis not present

## 2014-12-15 DIAGNOSIS — F329 Major depressive disorder, single episode, unspecified: Secondary | ICD-10-CM | POA: Diagnosis not present

## 2014-12-16 DIAGNOSIS — F329 Major depressive disorder, single episode, unspecified: Secondary | ICD-10-CM | POA: Diagnosis not present

## 2014-12-16 DIAGNOSIS — R569 Unspecified convulsions: Secondary | ICD-10-CM | POA: Diagnosis not present

## 2014-12-17 DIAGNOSIS — F329 Major depressive disorder, single episode, unspecified: Secondary | ICD-10-CM | POA: Diagnosis not present

## 2014-12-17 DIAGNOSIS — R569 Unspecified convulsions: Secondary | ICD-10-CM | POA: Diagnosis not present

## 2014-12-18 DIAGNOSIS — F329 Major depressive disorder, single episode, unspecified: Secondary | ICD-10-CM | POA: Diagnosis not present

## 2014-12-18 DIAGNOSIS — R569 Unspecified convulsions: Secondary | ICD-10-CM | POA: Diagnosis not present

## 2014-12-19 DIAGNOSIS — R569 Unspecified convulsions: Secondary | ICD-10-CM | POA: Diagnosis not present

## 2014-12-19 DIAGNOSIS — F329 Major depressive disorder, single episode, unspecified: Secondary | ICD-10-CM | POA: Diagnosis not present

## 2014-12-20 DIAGNOSIS — R569 Unspecified convulsions: Secondary | ICD-10-CM | POA: Diagnosis not present

## 2014-12-20 DIAGNOSIS — F329 Major depressive disorder, single episode, unspecified: Secondary | ICD-10-CM | POA: Diagnosis not present

## 2014-12-21 DIAGNOSIS — F329 Major depressive disorder, single episode, unspecified: Secondary | ICD-10-CM | POA: Diagnosis not present

## 2014-12-21 DIAGNOSIS — R569 Unspecified convulsions: Secondary | ICD-10-CM | POA: Diagnosis not present

## 2014-12-22 DIAGNOSIS — F329 Major depressive disorder, single episode, unspecified: Secondary | ICD-10-CM | POA: Diagnosis not present

## 2014-12-22 DIAGNOSIS — R569 Unspecified convulsions: Secondary | ICD-10-CM | POA: Diagnosis not present

## 2014-12-23 DIAGNOSIS — R569 Unspecified convulsions: Secondary | ICD-10-CM | POA: Diagnosis not present

## 2014-12-23 DIAGNOSIS — F329 Major depressive disorder, single episode, unspecified: Secondary | ICD-10-CM | POA: Diagnosis not present

## 2014-12-24 DIAGNOSIS — R569 Unspecified convulsions: Secondary | ICD-10-CM | POA: Diagnosis not present

## 2014-12-24 DIAGNOSIS — F329 Major depressive disorder, single episode, unspecified: Secondary | ICD-10-CM | POA: Diagnosis not present

## 2014-12-25 DIAGNOSIS — F329 Major depressive disorder, single episode, unspecified: Secondary | ICD-10-CM | POA: Diagnosis not present

## 2014-12-25 DIAGNOSIS — R569 Unspecified convulsions: Secondary | ICD-10-CM | POA: Diagnosis not present

## 2014-12-26 DIAGNOSIS — R569 Unspecified convulsions: Secondary | ICD-10-CM | POA: Diagnosis not present

## 2014-12-26 DIAGNOSIS — F329 Major depressive disorder, single episode, unspecified: Secondary | ICD-10-CM | POA: Diagnosis not present

## 2014-12-27 DIAGNOSIS — R569 Unspecified convulsions: Secondary | ICD-10-CM | POA: Diagnosis not present

## 2014-12-27 DIAGNOSIS — F329 Major depressive disorder, single episode, unspecified: Secondary | ICD-10-CM | POA: Diagnosis not present

## 2014-12-28 DIAGNOSIS — F329 Major depressive disorder, single episode, unspecified: Secondary | ICD-10-CM | POA: Diagnosis not present

## 2014-12-28 DIAGNOSIS — R569 Unspecified convulsions: Secondary | ICD-10-CM | POA: Diagnosis not present

## 2014-12-29 DIAGNOSIS — R569 Unspecified convulsions: Secondary | ICD-10-CM | POA: Diagnosis not present

## 2014-12-29 DIAGNOSIS — F329 Major depressive disorder, single episode, unspecified: Secondary | ICD-10-CM | POA: Diagnosis not present

## 2014-12-30 DIAGNOSIS — R569 Unspecified convulsions: Secondary | ICD-10-CM | POA: Diagnosis not present

## 2014-12-30 DIAGNOSIS — F329 Major depressive disorder, single episode, unspecified: Secondary | ICD-10-CM | POA: Diagnosis not present

## 2014-12-31 DIAGNOSIS — R569 Unspecified convulsions: Secondary | ICD-10-CM | POA: Diagnosis not present

## 2014-12-31 DIAGNOSIS — F329 Major depressive disorder, single episode, unspecified: Secondary | ICD-10-CM | POA: Diagnosis not present

## 2015-01-01 DIAGNOSIS — F329 Major depressive disorder, single episode, unspecified: Secondary | ICD-10-CM | POA: Diagnosis not present

## 2015-01-01 DIAGNOSIS — R569 Unspecified convulsions: Secondary | ICD-10-CM | POA: Diagnosis not present

## 2015-01-02 DIAGNOSIS — F329 Major depressive disorder, single episode, unspecified: Secondary | ICD-10-CM | POA: Diagnosis not present

## 2015-01-02 DIAGNOSIS — R569 Unspecified convulsions: Secondary | ICD-10-CM | POA: Diagnosis not present

## 2015-01-03 DIAGNOSIS — R569 Unspecified convulsions: Secondary | ICD-10-CM | POA: Diagnosis not present

## 2015-01-03 DIAGNOSIS — F329 Major depressive disorder, single episode, unspecified: Secondary | ICD-10-CM | POA: Diagnosis not present

## 2015-01-04 DIAGNOSIS — F329 Major depressive disorder, single episode, unspecified: Secondary | ICD-10-CM | POA: Diagnosis not present

## 2015-01-04 DIAGNOSIS — R569 Unspecified convulsions: Secondary | ICD-10-CM | POA: Diagnosis not present

## 2015-01-05 DIAGNOSIS — R569 Unspecified convulsions: Secondary | ICD-10-CM | POA: Diagnosis not present

## 2015-01-05 DIAGNOSIS — F329 Major depressive disorder, single episode, unspecified: Secondary | ICD-10-CM | POA: Diagnosis not present

## 2015-01-08 DIAGNOSIS — R569 Unspecified convulsions: Secondary | ICD-10-CM | POA: Diagnosis not present

## 2015-01-08 DIAGNOSIS — F329 Major depressive disorder, single episode, unspecified: Secondary | ICD-10-CM | POA: Diagnosis not present

## 2015-01-09 DIAGNOSIS — R569 Unspecified convulsions: Secondary | ICD-10-CM | POA: Diagnosis not present

## 2015-01-09 DIAGNOSIS — F329 Major depressive disorder, single episode, unspecified: Secondary | ICD-10-CM | POA: Diagnosis not present

## 2015-01-10 DIAGNOSIS — F329 Major depressive disorder, single episode, unspecified: Secondary | ICD-10-CM | POA: Diagnosis not present

## 2015-01-10 DIAGNOSIS — R569 Unspecified convulsions: Secondary | ICD-10-CM | POA: Diagnosis not present

## 2015-01-11 DIAGNOSIS — F329 Major depressive disorder, single episode, unspecified: Secondary | ICD-10-CM | POA: Diagnosis not present

## 2015-01-11 DIAGNOSIS — R569 Unspecified convulsions: Secondary | ICD-10-CM | POA: Diagnosis not present

## 2015-01-12 DIAGNOSIS — R569 Unspecified convulsions: Secondary | ICD-10-CM | POA: Diagnosis not present

## 2015-01-12 DIAGNOSIS — F329 Major depressive disorder, single episode, unspecified: Secondary | ICD-10-CM | POA: Diagnosis not present

## 2015-01-13 DIAGNOSIS — F329 Major depressive disorder, single episode, unspecified: Secondary | ICD-10-CM | POA: Diagnosis not present

## 2015-01-13 DIAGNOSIS — R569 Unspecified convulsions: Secondary | ICD-10-CM | POA: Diagnosis not present

## 2015-01-14 DIAGNOSIS — R569 Unspecified convulsions: Secondary | ICD-10-CM | POA: Diagnosis not present

## 2015-01-14 DIAGNOSIS — F329 Major depressive disorder, single episode, unspecified: Secondary | ICD-10-CM | POA: Diagnosis not present

## 2015-01-15 DIAGNOSIS — F329 Major depressive disorder, single episode, unspecified: Secondary | ICD-10-CM | POA: Diagnosis not present

## 2015-01-15 DIAGNOSIS — R569 Unspecified convulsions: Secondary | ICD-10-CM | POA: Diagnosis not present

## 2015-01-16 DIAGNOSIS — R569 Unspecified convulsions: Secondary | ICD-10-CM | POA: Diagnosis not present

## 2015-01-16 DIAGNOSIS — F329 Major depressive disorder, single episode, unspecified: Secondary | ICD-10-CM | POA: Diagnosis not present

## 2015-01-17 DIAGNOSIS — F329 Major depressive disorder, single episode, unspecified: Secondary | ICD-10-CM | POA: Diagnosis not present

## 2015-01-17 DIAGNOSIS — R569 Unspecified convulsions: Secondary | ICD-10-CM | POA: Diagnosis not present

## 2015-01-18 DIAGNOSIS — F329 Major depressive disorder, single episode, unspecified: Secondary | ICD-10-CM | POA: Diagnosis not present

## 2015-01-18 DIAGNOSIS — R569 Unspecified convulsions: Secondary | ICD-10-CM | POA: Diagnosis not present

## 2015-01-19 DIAGNOSIS — R569 Unspecified convulsions: Secondary | ICD-10-CM | POA: Diagnosis not present

## 2015-01-19 DIAGNOSIS — F329 Major depressive disorder, single episode, unspecified: Secondary | ICD-10-CM | POA: Diagnosis not present

## 2015-01-20 DIAGNOSIS — F329 Major depressive disorder, single episode, unspecified: Secondary | ICD-10-CM | POA: Diagnosis not present

## 2015-01-20 DIAGNOSIS — R569 Unspecified convulsions: Secondary | ICD-10-CM | POA: Diagnosis not present

## 2015-01-21 DIAGNOSIS — F329 Major depressive disorder, single episode, unspecified: Secondary | ICD-10-CM | POA: Diagnosis not present

## 2015-01-21 DIAGNOSIS — R569 Unspecified convulsions: Secondary | ICD-10-CM | POA: Diagnosis not present

## 2015-01-22 DIAGNOSIS — R569 Unspecified convulsions: Secondary | ICD-10-CM | POA: Diagnosis not present

## 2015-01-22 DIAGNOSIS — F329 Major depressive disorder, single episode, unspecified: Secondary | ICD-10-CM | POA: Diagnosis not present

## 2015-01-23 DIAGNOSIS — F329 Major depressive disorder, single episode, unspecified: Secondary | ICD-10-CM | POA: Diagnosis not present

## 2015-01-23 DIAGNOSIS — R569 Unspecified convulsions: Secondary | ICD-10-CM | POA: Diagnosis not present

## 2015-01-24 DIAGNOSIS — R569 Unspecified convulsions: Secondary | ICD-10-CM | POA: Diagnosis not present

## 2015-01-24 DIAGNOSIS — F329 Major depressive disorder, single episode, unspecified: Secondary | ICD-10-CM | POA: Diagnosis not present

## 2015-01-25 DIAGNOSIS — R569 Unspecified convulsions: Secondary | ICD-10-CM | POA: Diagnosis not present

## 2015-01-25 DIAGNOSIS — F329 Major depressive disorder, single episode, unspecified: Secondary | ICD-10-CM | POA: Diagnosis not present

## 2015-01-26 DIAGNOSIS — R569 Unspecified convulsions: Secondary | ICD-10-CM | POA: Diagnosis not present

## 2015-01-26 DIAGNOSIS — F329 Major depressive disorder, single episode, unspecified: Secondary | ICD-10-CM | POA: Diagnosis not present

## 2015-01-27 DIAGNOSIS — R569 Unspecified convulsions: Secondary | ICD-10-CM | POA: Diagnosis not present

## 2015-01-27 DIAGNOSIS — F329 Major depressive disorder, single episode, unspecified: Secondary | ICD-10-CM | POA: Diagnosis not present

## 2015-01-28 DIAGNOSIS — R569 Unspecified convulsions: Secondary | ICD-10-CM | POA: Diagnosis not present

## 2015-01-28 DIAGNOSIS — F329 Major depressive disorder, single episode, unspecified: Secondary | ICD-10-CM | POA: Diagnosis not present

## 2015-01-29 DIAGNOSIS — G4459 Other complicated headache syndrome: Secondary | ICD-10-CM | POA: Diagnosis not present

## 2015-01-29 DIAGNOSIS — R569 Unspecified convulsions: Secondary | ICD-10-CM | POA: Diagnosis not present

## 2015-01-29 DIAGNOSIS — M25519 Pain in unspecified shoulder: Secondary | ICD-10-CM | POA: Diagnosis not present

## 2015-01-29 DIAGNOSIS — Z79899 Other long term (current) drug therapy: Secondary | ICD-10-CM | POA: Diagnosis not present

## 2015-01-29 DIAGNOSIS — F329 Major depressive disorder, single episode, unspecified: Secondary | ICD-10-CM | POA: Diagnosis not present

## 2015-01-29 DIAGNOSIS — G40909 Epilepsy, unspecified, not intractable, without status epilepticus: Secondary | ICD-10-CM | POA: Diagnosis not present

## 2015-01-30 DIAGNOSIS — F329 Major depressive disorder, single episode, unspecified: Secondary | ICD-10-CM | POA: Diagnosis not present

## 2015-01-30 DIAGNOSIS — R569 Unspecified convulsions: Secondary | ICD-10-CM | POA: Diagnosis not present

## 2015-01-31 DIAGNOSIS — F329 Major depressive disorder, single episode, unspecified: Secondary | ICD-10-CM | POA: Diagnosis not present

## 2015-01-31 DIAGNOSIS — R569 Unspecified convulsions: Secondary | ICD-10-CM | POA: Diagnosis not present

## 2015-02-01 DIAGNOSIS — F329 Major depressive disorder, single episode, unspecified: Secondary | ICD-10-CM | POA: Diagnosis not present

## 2015-02-01 DIAGNOSIS — R569 Unspecified convulsions: Secondary | ICD-10-CM | POA: Diagnosis not present

## 2015-02-02 DIAGNOSIS — F329 Major depressive disorder, single episode, unspecified: Secondary | ICD-10-CM | POA: Diagnosis not present

## 2015-02-02 DIAGNOSIS — R569 Unspecified convulsions: Secondary | ICD-10-CM | POA: Diagnosis not present

## 2015-02-03 DIAGNOSIS — F329 Major depressive disorder, single episode, unspecified: Secondary | ICD-10-CM | POA: Diagnosis not present

## 2015-02-03 DIAGNOSIS — R569 Unspecified convulsions: Secondary | ICD-10-CM | POA: Diagnosis not present

## 2015-02-04 DIAGNOSIS — F329 Major depressive disorder, single episode, unspecified: Secondary | ICD-10-CM | POA: Diagnosis not present

## 2015-02-04 DIAGNOSIS — R569 Unspecified convulsions: Secondary | ICD-10-CM | POA: Diagnosis not present

## 2015-02-05 DIAGNOSIS — R569 Unspecified convulsions: Secondary | ICD-10-CM | POA: Diagnosis not present

## 2015-02-05 DIAGNOSIS — F329 Major depressive disorder, single episode, unspecified: Secondary | ICD-10-CM | POA: Diagnosis not present

## 2015-02-08 DIAGNOSIS — F329 Major depressive disorder, single episode, unspecified: Secondary | ICD-10-CM | POA: Diagnosis not present

## 2015-02-08 DIAGNOSIS — R569 Unspecified convulsions: Secondary | ICD-10-CM | POA: Diagnosis not present

## 2015-02-09 DIAGNOSIS — R569 Unspecified convulsions: Secondary | ICD-10-CM | POA: Diagnosis not present

## 2015-02-09 DIAGNOSIS — F329 Major depressive disorder, single episode, unspecified: Secondary | ICD-10-CM | POA: Diagnosis not present

## 2015-02-10 DIAGNOSIS — R569 Unspecified convulsions: Secondary | ICD-10-CM | POA: Diagnosis not present

## 2015-02-10 DIAGNOSIS — F329 Major depressive disorder, single episode, unspecified: Secondary | ICD-10-CM | POA: Diagnosis not present

## 2015-02-11 DIAGNOSIS — F329 Major depressive disorder, single episode, unspecified: Secondary | ICD-10-CM | POA: Diagnosis not present

## 2015-02-11 DIAGNOSIS — R569 Unspecified convulsions: Secondary | ICD-10-CM | POA: Diagnosis not present

## 2015-02-12 DIAGNOSIS — F329 Major depressive disorder, single episode, unspecified: Secondary | ICD-10-CM | POA: Diagnosis not present

## 2015-02-12 DIAGNOSIS — Z79899 Other long term (current) drug therapy: Secondary | ICD-10-CM | POA: Diagnosis not present

## 2015-02-12 DIAGNOSIS — M25519 Pain in unspecified shoulder: Secondary | ICD-10-CM | POA: Diagnosis not present

## 2015-02-12 DIAGNOSIS — R569 Unspecified convulsions: Secondary | ICD-10-CM | POA: Diagnosis not present

## 2015-02-12 DIAGNOSIS — G4459 Other complicated headache syndrome: Secondary | ICD-10-CM | POA: Diagnosis not present

## 2015-02-12 DIAGNOSIS — G40909 Epilepsy, unspecified, not intractable, without status epilepticus: Secondary | ICD-10-CM | POA: Diagnosis not present

## 2015-02-13 DIAGNOSIS — R569 Unspecified convulsions: Secondary | ICD-10-CM | POA: Diagnosis not present

## 2015-02-13 DIAGNOSIS — F329 Major depressive disorder, single episode, unspecified: Secondary | ICD-10-CM | POA: Diagnosis not present

## 2015-02-14 DIAGNOSIS — R569 Unspecified convulsions: Secondary | ICD-10-CM | POA: Diagnosis not present

## 2015-02-14 DIAGNOSIS — F329 Major depressive disorder, single episode, unspecified: Secondary | ICD-10-CM | POA: Diagnosis not present

## 2015-02-15 DIAGNOSIS — F329 Major depressive disorder, single episode, unspecified: Secondary | ICD-10-CM | POA: Diagnosis not present

## 2015-02-15 DIAGNOSIS — R569 Unspecified convulsions: Secondary | ICD-10-CM | POA: Diagnosis not present

## 2015-02-16 DIAGNOSIS — F329 Major depressive disorder, single episode, unspecified: Secondary | ICD-10-CM | POA: Diagnosis not present

## 2015-02-16 DIAGNOSIS — R569 Unspecified convulsions: Secondary | ICD-10-CM | POA: Diagnosis not present

## 2015-02-17 DIAGNOSIS — F329 Major depressive disorder, single episode, unspecified: Secondary | ICD-10-CM | POA: Diagnosis not present

## 2015-02-17 DIAGNOSIS — R569 Unspecified convulsions: Secondary | ICD-10-CM | POA: Diagnosis not present

## 2015-02-18 DIAGNOSIS — R569 Unspecified convulsions: Secondary | ICD-10-CM | POA: Diagnosis not present

## 2015-02-18 DIAGNOSIS — F329 Major depressive disorder, single episode, unspecified: Secondary | ICD-10-CM | POA: Diagnosis not present

## 2015-02-19 DIAGNOSIS — F329 Major depressive disorder, single episode, unspecified: Secondary | ICD-10-CM | POA: Diagnosis not present

## 2015-02-19 DIAGNOSIS — F17200 Nicotine dependence, unspecified, uncomplicated: Secondary | ICD-10-CM | POA: Diagnosis not present

## 2015-02-19 DIAGNOSIS — E785 Hyperlipidemia, unspecified: Secondary | ICD-10-CM | POA: Diagnosis not present

## 2015-02-19 DIAGNOSIS — R569 Unspecified convulsions: Secondary | ICD-10-CM | POA: Diagnosis not present

## 2015-02-19 DIAGNOSIS — J449 Chronic obstructive pulmonary disease, unspecified: Secondary | ICD-10-CM | POA: Diagnosis not present

## 2015-02-19 DIAGNOSIS — K219 Gastro-esophageal reflux disease without esophagitis: Secondary | ICD-10-CM | POA: Diagnosis not present

## 2015-02-20 DIAGNOSIS — R569 Unspecified convulsions: Secondary | ICD-10-CM | POA: Diagnosis not present

## 2015-02-20 DIAGNOSIS — F329 Major depressive disorder, single episode, unspecified: Secondary | ICD-10-CM | POA: Diagnosis not present

## 2015-02-21 DIAGNOSIS — R569 Unspecified convulsions: Secondary | ICD-10-CM | POA: Diagnosis not present

## 2015-02-21 DIAGNOSIS — F329 Major depressive disorder, single episode, unspecified: Secondary | ICD-10-CM | POA: Diagnosis not present

## 2015-02-22 DIAGNOSIS — R569 Unspecified convulsions: Secondary | ICD-10-CM | POA: Diagnosis not present

## 2015-02-22 DIAGNOSIS — F329 Major depressive disorder, single episode, unspecified: Secondary | ICD-10-CM | POA: Diagnosis not present

## 2015-02-23 DIAGNOSIS — R569 Unspecified convulsions: Secondary | ICD-10-CM | POA: Diagnosis not present

## 2015-02-23 DIAGNOSIS — F329 Major depressive disorder, single episode, unspecified: Secondary | ICD-10-CM | POA: Diagnosis not present

## 2015-02-24 DIAGNOSIS — R569 Unspecified convulsions: Secondary | ICD-10-CM | POA: Diagnosis not present

## 2015-02-24 DIAGNOSIS — F329 Major depressive disorder, single episode, unspecified: Secondary | ICD-10-CM | POA: Diagnosis not present

## 2015-02-25 DIAGNOSIS — F329 Major depressive disorder, single episode, unspecified: Secondary | ICD-10-CM | POA: Diagnosis not present

## 2015-02-25 DIAGNOSIS — R569 Unspecified convulsions: Secondary | ICD-10-CM | POA: Diagnosis not present

## 2015-02-26 DIAGNOSIS — R569 Unspecified convulsions: Secondary | ICD-10-CM | POA: Diagnosis not present

## 2015-02-26 DIAGNOSIS — F329 Major depressive disorder, single episode, unspecified: Secondary | ICD-10-CM | POA: Diagnosis not present

## 2015-02-27 DIAGNOSIS — F329 Major depressive disorder, single episode, unspecified: Secondary | ICD-10-CM | POA: Diagnosis not present

## 2015-02-27 DIAGNOSIS — Z79899 Other long term (current) drug therapy: Secondary | ICD-10-CM | POA: Diagnosis not present

## 2015-02-27 DIAGNOSIS — F3181 Bipolar II disorder: Secondary | ICD-10-CM | POA: Diagnosis not present

## 2015-02-27 DIAGNOSIS — R569 Unspecified convulsions: Secondary | ICD-10-CM | POA: Diagnosis not present

## 2015-02-28 DIAGNOSIS — F329 Major depressive disorder, single episode, unspecified: Secondary | ICD-10-CM | POA: Diagnosis not present

## 2015-02-28 DIAGNOSIS — R569 Unspecified convulsions: Secondary | ICD-10-CM | POA: Diagnosis not present

## 2015-03-01 DIAGNOSIS — R569 Unspecified convulsions: Secondary | ICD-10-CM | POA: Diagnosis not present

## 2015-03-01 DIAGNOSIS — F329 Major depressive disorder, single episode, unspecified: Secondary | ICD-10-CM | POA: Diagnosis not present

## 2015-03-02 DIAGNOSIS — R569 Unspecified convulsions: Secondary | ICD-10-CM | POA: Diagnosis not present

## 2015-03-02 DIAGNOSIS — F329 Major depressive disorder, single episode, unspecified: Secondary | ICD-10-CM | POA: Diagnosis not present

## 2015-03-03 DIAGNOSIS — R569 Unspecified convulsions: Secondary | ICD-10-CM | POA: Diagnosis not present

## 2015-03-03 DIAGNOSIS — F329 Major depressive disorder, single episode, unspecified: Secondary | ICD-10-CM | POA: Diagnosis not present

## 2015-03-05 DIAGNOSIS — Z79899 Other long term (current) drug therapy: Secondary | ICD-10-CM | POA: Diagnosis not present

## 2015-03-05 DIAGNOSIS — G40909 Epilepsy, unspecified, not intractable, without status epilepticus: Secondary | ICD-10-CM | POA: Diagnosis not present

## 2015-03-05 DIAGNOSIS — M25519 Pain in unspecified shoulder: Secondary | ICD-10-CM | POA: Diagnosis not present

## 2015-03-05 DIAGNOSIS — G4459 Other complicated headache syndrome: Secondary | ICD-10-CM | POA: Diagnosis not present

## 2015-03-11 DIAGNOSIS — R569 Unspecified convulsions: Secondary | ICD-10-CM | POA: Diagnosis not present

## 2015-03-11 DIAGNOSIS — F329 Major depressive disorder, single episode, unspecified: Secondary | ICD-10-CM | POA: Diagnosis not present

## 2015-03-12 DIAGNOSIS — R569 Unspecified convulsions: Secondary | ICD-10-CM | POA: Diagnosis not present

## 2015-03-12 DIAGNOSIS — F329 Major depressive disorder, single episode, unspecified: Secondary | ICD-10-CM | POA: Diagnosis not present

## 2015-03-13 DIAGNOSIS — F329 Major depressive disorder, single episode, unspecified: Secondary | ICD-10-CM | POA: Diagnosis not present

## 2015-03-13 DIAGNOSIS — R569 Unspecified convulsions: Secondary | ICD-10-CM | POA: Diagnosis not present

## 2015-03-14 DIAGNOSIS — R569 Unspecified convulsions: Secondary | ICD-10-CM | POA: Diagnosis not present

## 2015-03-14 DIAGNOSIS — F329 Major depressive disorder, single episode, unspecified: Secondary | ICD-10-CM | POA: Diagnosis not present

## 2015-03-15 DIAGNOSIS — F329 Major depressive disorder, single episode, unspecified: Secondary | ICD-10-CM | POA: Diagnosis not present

## 2015-03-15 DIAGNOSIS — R569 Unspecified convulsions: Secondary | ICD-10-CM | POA: Diagnosis not present

## 2015-03-16 DIAGNOSIS — F329 Major depressive disorder, single episode, unspecified: Secondary | ICD-10-CM | POA: Diagnosis not present

## 2015-03-16 DIAGNOSIS — R569 Unspecified convulsions: Secondary | ICD-10-CM | POA: Diagnosis not present

## 2015-03-17 DIAGNOSIS — F329 Major depressive disorder, single episode, unspecified: Secondary | ICD-10-CM | POA: Diagnosis not present

## 2015-03-17 DIAGNOSIS — R569 Unspecified convulsions: Secondary | ICD-10-CM | POA: Diagnosis not present

## 2015-04-03 DIAGNOSIS — G4459 Other complicated headache syndrome: Secondary | ICD-10-CM | POA: Diagnosis not present

## 2015-04-03 DIAGNOSIS — G40909 Epilepsy, unspecified, not intractable, without status epilepticus: Secondary | ICD-10-CM | POA: Diagnosis not present

## 2015-04-03 DIAGNOSIS — Z79899 Other long term (current) drug therapy: Secondary | ICD-10-CM | POA: Diagnosis not present

## 2015-04-03 DIAGNOSIS — M25519 Pain in unspecified shoulder: Secondary | ICD-10-CM | POA: Diagnosis not present

## 2015-05-28 DIAGNOSIS — J449 Chronic obstructive pulmonary disease, unspecified: Secondary | ICD-10-CM | POA: Diagnosis not present

## 2015-05-28 DIAGNOSIS — F17290 Nicotine dependence, other tobacco product, uncomplicated: Secondary | ICD-10-CM | POA: Diagnosis not present

## 2015-05-28 DIAGNOSIS — J309 Allergic rhinitis, unspecified: Secondary | ICD-10-CM | POA: Diagnosis not present

## 2015-06-11 DIAGNOSIS — G894 Chronic pain syndrome: Secondary | ICD-10-CM | POA: Diagnosis not present

## 2015-06-11 DIAGNOSIS — G43711 Chronic migraine without aura, intractable, with status migrainosus: Secondary | ICD-10-CM | POA: Diagnosis not present

## 2015-06-11 DIAGNOSIS — Z79899 Other long term (current) drug therapy: Secondary | ICD-10-CM | POA: Diagnosis not present

## 2015-06-11 DIAGNOSIS — G40919 Epilepsy, unspecified, intractable, without status epilepticus: Secondary | ICD-10-CM | POA: Diagnosis not present

## 2015-06-26 ENCOUNTER — Other Ambulatory Visit: Payer: Self-pay | Admitting: Neurosurgery

## 2015-06-26 DIAGNOSIS — Z6830 Body mass index (BMI) 30.0-30.9, adult: Secondary | ICD-10-CM | POA: Diagnosis not present

## 2015-06-26 DIAGNOSIS — G40319 Generalized idiopathic epilepsy and epileptic syndromes, intractable, without status epilepticus: Secondary | ICD-10-CM | POA: Diagnosis not present

## 2015-07-16 ENCOUNTER — Encounter (HOSPITAL_COMMUNITY): Payer: Self-pay

## 2015-07-16 NOTE — Pre-Procedure Instructions (Signed)
Jeffrey Wilcox  07/16/2015      [id number] - [place], [place] - [address] [place] [address] Phone: [phone] Fax: [phone]    Your procedure is scheduled on Fri,Sept 16 @ 7:30 AM  Report to [facility] Admitting at 5:30 AM  Call this number if you have problems the morning of surgery:  [phone]   Remember:  Do not eat food or drink liquids after midnight.  Take these medicines the morning of surgery with A SIP OF WATER Albuterol<Bring Your Inhaler With You>,Xanax(Alprazolam),Lamictal(Lamotrigine),Dilantin(Phenytoin),Lyrica(Pregabalin),and Spiriva(Tiotropium)                   No Goody's,BC's,Aleve,Aspirin,Ibuprofen,Fish Oil,or any Herbal Medications   Do not wear jewelry.  Do not wear lotions, powders, or colognes.  You may wear deodorant.            Men may shave face and neck.  Do not bring valuables to the hospital.  [facility] is not responsible for any belongings or valuables.  Contacts, dentures or bridgework may not be worn into surgery.  Leave your suitcase in the car.  After surgery it may be brought to your room.  For patients admitted to the hospital, discharge time will be determined by your treatment team.  Patients discharged the day of surgery will not be allowed to drive home.    Special instructions:  [facility] - Preparing for Surgery  Before surgery, you can play an important role.  Because skin is not sterile, your skin needs to be as free of germs as possible.  You can reduce the number of germs on you skin by washing with CHG (chlorahexidine gluconate) soap before surgery.  CHG is an antiseptic cleaner which kills germs and bonds with the skin to continue killing germs even after washing.  Please DO NOT use if you have an allergy to CHG or antibacterial soaps.  If your skin becomes reddened/irritated stop using the CHG and inform your nurse when you arrive at Short Stay.  Do not  shave (including legs and underarms) for at least 48 hours prior to the first CHG shower.  You may shave your face.  Please follow these instructions carefully:   1.  Shower with CHG Soap the night before surgery and the                                morning of Surgery.  2.  If you choose to wash your hair, wash your hair first as usual with your       normal shampoo.  3.  After you shampoo, rinse your hair and body thoroughly to remove the                      Shampoo.  4.  Use CHG as you would any other liquid soap.  You can apply chg directly       to the skin and wash gently with scrungie or a clean washcloth.  5.  Apply the CHG Soap to your body ONLY FROM THE NECK DOWN.        Do not use on open wounds or open sores.  Avoid contact with your eyes,       ears, mouth and genitals (private parts).  Wash genitals (private parts)       with your normal soap.  6.  Wash thoroughly, paying special attention to the area where your surgery        will be performed.  7.  Thoroughly rinse your body with warm water from the neck down.  8.  DO NOT shower/wash with your normal soap after using and rinsing off       the CHG Soap.  9.  Pat yourself dry with a clean towel.            10.  Wear clean pajamas.            11.  Place clean sheets on your bed the night of your first shower and do not        sleep with pets.  Day of Surgery  Do not apply any lotions/deoderants the morning of surgery.  Please wear clean clothes to the hospital/surgery center.    Please read over the following fact sheets that you were given. Pain Booklet, Coughing and Deep Breathing and Surgical Site Infection Prevention

## 2015-07-17 ENCOUNTER — Encounter (HOSPITAL_COMMUNITY): Payer: Self-pay

## 2015-07-17 ENCOUNTER — Encounter (HOSPITAL_COMMUNITY)
Admission: RE | Admit: 2015-07-17 | Discharge: 2015-07-17 | Disposition: A | Discharge status: Home / Self Care | Payer: Medicare Other | Source: Ambulatory Visit | Attending: Neurosurgery | Admitting: Neurosurgery

## 2015-07-17 DIAGNOSIS — Z01812 Encounter for preprocedural laboratory examination: Secondary | ICD-10-CM | POA: Diagnosis not present

## 2015-07-17 DIAGNOSIS — Z01810 Encounter for preprocedural cardiovascular examination: Secondary | ICD-10-CM | POA: Insufficient documentation

## 2015-07-17 DIAGNOSIS — G40919 Epilepsy, unspecified, intractable, without status epilepticus: Secondary | ICD-10-CM | POA: Diagnosis not present

## 2015-07-17 HISTORY — DX: Reserved for inherently not codable concepts without codable children: IMO0001

## 2015-07-17 HISTORY — DX: Hyperlipidemia, unspecified: E78.5

## 2015-07-17 HISTORY — DX: Pain in unspecified joint: M25.50

## 2015-07-17 HISTORY — DX: Dizziness and giddiness: R42

## 2015-07-17 HISTORY — DX: Personal history of colonic polyps: Z86.010

## 2015-07-17 HISTORY — DX: Personal history of colon polyps, unspecified: Z86.0100

## 2015-07-17 HISTORY — DX: Pneumonia, unspecified organism: J18.9

## 2015-07-17 HISTORY — DX: Personal history of other diseases of the nervous system and sense organs: Z86.69

## 2015-07-17 HISTORY — DX: Dorsalgia, unspecified: M54.9

## 2015-07-17 HISTORY — DX: Personal history of other diseases of the respiratory system: Z87.09

## 2015-07-17 HISTORY — DX: Benign prostatic hyperplasia without lower urinary tract symptoms: N40.0

## 2015-07-17 HISTORY — DX: Other chronic pain: G89.29

## 2015-07-17 HISTORY — DX: Other muscle spasm: M62.838

## 2015-07-17 HISTORY — DX: Other difficulties with micturition: R39.198

## 2015-07-17 HISTORY — DX: Effusion, unspecified joint: M25.40

## 2015-07-17 HISTORY — DX: Post-traumatic stress disorder, unspecified: F43.10

## 2015-07-17 HISTORY — DX: Unspecified osteoarthritis, unspecified site: M19.90

## 2015-07-17 LAB — CBC
HEMATOCRIT: 50.3 % (ref 39.0–52.0)
HEMOGLOBIN: 16.8 g/dL (ref 13.0–17.0)
MCH: 31.9 pg (ref 26.0–34.0)
MCHC: 33.4 g/dL (ref 30.0–36.0)
MCV: 95.6 fL (ref 78.0–100.0)
Platelets: 167 10*3/uL (ref 150–400)
RBC: 5.26 MIL/uL (ref 4.22–5.81)
RDW: 13.6 % (ref 11.5–15.5)
WBC: 8.9 10*3/uL (ref 4.0–10.5)

## 2015-07-17 LAB — BASIC METABOLIC PANEL
Anion gap: 6 (ref 5–15)
BUN: 12 mg/dL (ref 6–20)
CHLORIDE: 105 mmol/L (ref 101–111)
CO2: 28 mmol/L (ref 22–32)
Calcium: 8.9 mg/dL (ref 8.9–10.3)
Creatinine, Ser: 0.87 mg/dL (ref 0.61–1.24)
GFR calc Af Amer: >60 mL/min (ref >60)
GFR calc non Af Amer: >60 mL/min (ref >60)
GLUCOSE: 100 mg/dL — AB (ref 65–99)
POTASSIUM: 4.7 mmol/L (ref 3.5–5.1)
Sodium: 139 mmol/L (ref 135–145)

## 2015-07-17 LAB — NO BLOOD PRODUCTS

## 2015-07-17 NOTE — Progress Notes (Signed)
Denies having a cardiologist  Medical Md is Dr.Fanta in [place]  Dr.Doonquah is Neurologist  Denies ever having an echo/stress test/heart cath  Denies EKG or CXR in past yr

## 2015-07-25 MED ORDER — VANCOMYCIN HCL 10 G IV SOLR
1500.0000 mg | INTRAVENOUS | Status: AC
  Administered 2015-07-26: 1500 mg via INTRAVENOUS
  Filled 2015-07-25: qty 1500

## 2015-07-26 ENCOUNTER — Inpatient Hospital Stay (HOSPITAL_COMMUNITY): Payer: Medicare Other | Admitting: Certified Registered Nurse Anesthetist

## 2015-07-26 ENCOUNTER — Encounter (HOSPITAL_COMMUNITY)
Admission: RE | Disposition: A | Discharge status: Home / Self Care | Payer: Self-pay | Source: Ambulatory Visit | Attending: Neurosurgery

## 2015-07-26 ENCOUNTER — Inpatient Hospital Stay (HOSPITAL_COMMUNITY): Payer: Medicare Other

## 2015-07-26 ENCOUNTER — Inpatient Hospital Stay (HOSPITAL_COMMUNITY)
Admission: RE | Admit: 2015-07-26 | Discharge: 2015-07-26 | DRG: 041 | Disposition: A | Discharge status: Home / Self Care | Payer: Medicare Other | Source: Ambulatory Visit | Attending: Neurosurgery | Admitting: Neurosurgery

## 2015-07-26 DIAGNOSIS — M62838 Other muscle spasm: Secondary | ICD-10-CM | POA: Diagnosis not present

## 2015-07-26 DIAGNOSIS — G43909 Migraine, unspecified, not intractable, without status migrainosus: Secondary | ICD-10-CM | POA: Diagnosis not present

## 2015-07-26 DIAGNOSIS — J9589 Other postprocedural complications and disorders of respiratory system, not elsewhere classified: Secondary | ICD-10-CM | POA: Diagnosis not present

## 2015-07-26 DIAGNOSIS — Z91018 Allergy to other foods: Secondary | ICD-10-CM

## 2015-07-26 DIAGNOSIS — F1290 Cannabis use, unspecified, uncomplicated: Secondary | ICD-10-CM | POA: Diagnosis present

## 2015-07-26 DIAGNOSIS — E785 Hyperlipidemia, unspecified: Secondary | ICD-10-CM | POA: Diagnosis present

## 2015-07-26 DIAGNOSIS — J95821 Acute postprocedural respiratory failure: Secondary | ICD-10-CM | POA: Diagnosis not present

## 2015-07-26 DIAGNOSIS — Z79899 Other long term (current) drug therapy: Secondary | ICD-10-CM

## 2015-07-26 DIAGNOSIS — Z91048 Other nonmedicinal substance allergy status: Secondary | ICD-10-CM | POA: Diagnosis not present

## 2015-07-26 DIAGNOSIS — J441 Chronic obstructive pulmonary disease with (acute) exacerbation: Secondary | ICD-10-CM | POA: Diagnosis not present

## 2015-07-26 DIAGNOSIS — E872 Acidosis: Secondary | ICD-10-CM | POA: Diagnosis not present

## 2015-07-26 DIAGNOSIS — Z4682 Encounter for fitting and adjustment of non-vascular catheter: Secondary | ICD-10-CM | POA: Diagnosis not present

## 2015-07-26 DIAGNOSIS — Z86010 Personal history of colonic polyps: Secondary | ICD-10-CM | POA: Diagnosis not present

## 2015-07-26 DIAGNOSIS — G40919 Epilepsy, unspecified, intractable, without status epilepticus: Principal | ICD-10-CM | POA: Diagnosis present

## 2015-07-26 DIAGNOSIS — F322 Major depressive disorder, single episode, severe without psychotic features: Secondary | ICD-10-CM | POA: Diagnosis not present

## 2015-07-26 DIAGNOSIS — M549 Dorsalgia, unspecified: Secondary | ICD-10-CM | POA: Diagnosis not present

## 2015-07-26 DIAGNOSIS — M069 Rheumatoid arthritis, unspecified: Secondary | ICD-10-CM | POA: Diagnosis not present

## 2015-07-26 DIAGNOSIS — R0902 Hypoxemia: Secondary | ICD-10-CM | POA: Diagnosis not present

## 2015-07-26 DIAGNOSIS — G40909 Epilepsy, unspecified, not intractable, without status epilepticus: Secondary | ICD-10-CM

## 2015-07-26 DIAGNOSIS — M1990 Unspecified osteoarthritis, unspecified site: Secondary | ICD-10-CM | POA: Diagnosis not present

## 2015-07-26 DIAGNOSIS — F4310 Post-traumatic stress disorder, unspecified: Secondary | ICD-10-CM | POA: Diagnosis not present

## 2015-07-26 DIAGNOSIS — Z880 Allergy status to penicillin: Secondary | ICD-10-CM | POA: Diagnosis not present

## 2015-07-26 DIAGNOSIS — T8579XA Infection and inflammatory reaction due to other internal prosthetic devices, implants and grafts, initial encounter: Secondary | ICD-10-CM

## 2015-07-26 DIAGNOSIS — F419 Anxiety disorder, unspecified: Secondary | ICD-10-CM | POA: Diagnosis present

## 2015-07-26 DIAGNOSIS — F17210 Nicotine dependence, cigarettes, uncomplicated: Secondary | ICD-10-CM | POA: Diagnosis present

## 2015-07-26 DIAGNOSIS — N400 Enlarged prostate without lower urinary tract symptoms: Secondary | ICD-10-CM | POA: Diagnosis not present

## 2015-07-26 DIAGNOSIS — G8929 Other chronic pain: Secondary | ICD-10-CM | POA: Diagnosis not present

## 2015-07-26 HISTORY — PX: VAGUS NERVE STIMULATOR INSERTION: SHX348

## 2015-07-26 LAB — RAPID URINE DRUG SCREEN, HOSP PERFORMED
AMPHETAMINES: NOT DETECTED
BARBITURATES: NOT DETECTED
BENZODIAZEPINES: POSITIVE — AB
Cocaine: NOT DETECTED
Opiates: NOT DETECTED
TETRAHYDROCANNABINOL: POSITIVE — AB

## 2015-07-26 LAB — BLOOD GAS, ARTERIAL
ACID-BASE DEFICIT: 2.1 mmol/L — AB (ref 0.0–2.0)
Bicarbonate: 23.5 mEq/L (ref 20.0–24.0)
Drawn by: 441371
FIO2: 1
LHR: 16 {breaths}/min
MECHVT: 580 mL
O2 SAT: 99.2 %
PEEP/CPAP: 5 cmH2O
Patient temperature: 98.6
TCO2: 25.1 mmol/L (ref 0–100)
pCO2 arterial: 50.6 mmHg — ABNORMAL HIGH (ref 35.0–45.0)
pH, Arterial: 7.289 — ABNORMAL LOW (ref 7.350–7.450)
pO2, Arterial: 224 mmHg — ABNORMAL HIGH (ref 80.0–100.0)

## 2015-07-26 LAB — URINALYSIS, ROUTINE W REFLEX MICROSCOPIC
BILIRUBIN URINE: NEGATIVE
GLUCOSE, UA: NEGATIVE mg/dL
HGB URINE DIPSTICK: NEGATIVE
KETONES UR: NEGATIVE mg/dL
Leukocytes, UA: NEGATIVE
Nitrite: NEGATIVE
PROTEIN: NEGATIVE mg/dL
Specific Gravity, Urine: 1.021 (ref 1.005–1.030)
Urobilinogen, UA: 0.2 mg/dL (ref 0.0–1.0)
pH: 5.5 (ref 5.0–8.0)

## 2015-07-26 LAB — LACTIC ACID, PLASMA
Lactic Acid, Venous: 3.3 mmol/L (ref 0.5–2.0)
Lactic Acid, Venous: 2.8 mmol/L (ref 0.5–2.0)

## 2015-07-26 SURGERY — VAGAL NERVE STIMULATOR IMPLANT
Anesthesia: General

## 2015-07-26 MED ORDER — BUPIVACAINE HCL 0.5 % IJ SOLN
INTRAMUSCULAR | Status: DC | PRN
  Administered 2015-07-26: 16 mL

## 2015-07-26 MED ORDER — SODIUM CHLORIDE 0.9 % IV SOLN: INTRAVENOUS | Status: DC

## 2015-07-26 MED ORDER — SODIUM CHLORIDE 0.9 % IV SOLN: 250.0000 mL | INTRAVENOUS | Status: DC

## 2015-07-26 MED ORDER — PROPOFOL 10 MG/ML IV BOLUS
INTRAVENOUS | Status: DC | PRN
  Administered 2015-07-26 (×3): 50 mg via INTRAVENOUS
  Administered 2015-07-26: 200 mg via INTRAVENOUS

## 2015-07-26 MED ORDER — LIDOCAINE HCL (CARDIAC) 20 MG/ML IV SOLN
INTRAVENOUS | Status: AC
  Filled 2015-07-26: qty 5

## 2015-07-26 MED ORDER — MIDAZOLAM HCL 2 MG/2ML IJ SOLN
INTRAMUSCULAR | Status: AC
  Filled 2015-07-26: qty 4

## 2015-07-26 MED ORDER — DEXAMETHASONE SODIUM PHOSPHATE 10 MG/ML IJ SOLN
INTRAMUSCULAR | Status: DC | PRN
  Administered 2015-07-26: 10 mg via INTRAVENOUS

## 2015-07-26 MED ORDER — SODIUM CHLORIDE 0.9 % IJ SOLN: 3.0000 mL | INTRAMUSCULAR | Status: DC | PRN

## 2015-07-26 MED ORDER — DIAZEPAM 5 MG PO TABS: 5.0000 mg | ORAL_TABLET | Freq: Four times a day (QID) | ORAL | Status: DC | PRN

## 2015-07-26 MED ORDER — PROPOFOL 1000 MG/100ML IV EMUL
5.0000 ug/kg/min | INTRAVENOUS | Status: DC
  Administered 2015-07-26: 5 ug/kg/min via INTRAVENOUS

## 2015-07-26 MED ORDER — SODIUM CHLORIDE 0.9 % IR SOLN
Status: DC | PRN
  Administered 2015-07-26: 08:00:00

## 2015-07-26 MED ORDER — PROPOFOL 1000 MG/100ML IV EMUL
INTRAVENOUS | Status: AC
  Filled 2015-07-26: qty 100

## 2015-07-26 MED ORDER — ONDANSETRON HCL 4 MG/2ML IJ SOLN
INTRAMUSCULAR | Status: DC | PRN
  Administered 2015-07-26: 4 mg via INTRAVENOUS

## 2015-07-26 MED ORDER — ALBUTEROL SULFATE HFA 108 (90 BASE) MCG/ACT IN AERS
INHALATION_SPRAY | RESPIRATORY_TRACT | Status: DC | PRN
  Administered 2015-07-26 (×2): 2 via RESPIRATORY_TRACT

## 2015-07-26 MED ORDER — MORPHINE SULFATE (PF) 2 MG/ML IV SOLN: 1.0000 mg | INTRAVENOUS | Status: DC | PRN

## 2015-07-26 MED ORDER — HEMOSTATIC AGENTS (NO CHARGE) OPTIME
TOPICAL | Status: DC | PRN
  Administered 2015-07-26: 1 via TOPICAL

## 2015-07-26 MED ORDER — 0.9 % SODIUM CHLORIDE (POUR BTL) OPTIME
TOPICAL | Status: DC | PRN
  Administered 2015-07-26: 1000 mL

## 2015-07-26 MED ORDER — PHENOL 1.4 % MT LIQD: 1.0000 | OROMUCOSAL | Status: DC | PRN

## 2015-07-26 MED ORDER — THROMBIN 5000 UNITS EX SOLR
CUTANEOUS | Status: DC | PRN
  Administered 2015-07-26 (×2): 5000 [IU] via TOPICAL

## 2015-07-26 MED ORDER — PROPOFOL 10 MG/ML IV BOLUS
INTRAVENOUS | Status: AC
  Filled 2015-07-26: qty 20

## 2015-07-26 MED ORDER — ACETAMINOPHEN 650 MG RE SUPP: 650.0000 mg | RECTAL | Status: DC | PRN

## 2015-07-26 MED ORDER — PANTOPRAZOLE SODIUM 40 MG IV SOLR: 40.0000 mg | Freq: Every day | INTRAVENOUS | Status: DC

## 2015-07-26 MED ORDER — ALBUTEROL SULFATE (2.5 MG/3ML) 0.083% IN NEBU
2.5000 mg | INHALATION_SOLUTION | Freq: Once | RESPIRATORY_TRACT | Status: AC
  Administered 2015-07-26: 2.5 mg via RESPIRATORY_TRACT
  Filled 2015-07-26: qty 3

## 2015-07-26 MED ORDER — ALBUMIN HUMAN 5 % IV SOLN
INTRAVENOUS | Status: DC | PRN
  Administered 2015-07-26: 09:00:00 via INTRAVENOUS

## 2015-07-26 MED ORDER — ROCURONIUM BROMIDE 100 MG/10ML IV SOLN
INTRAVENOUS | Status: DC | PRN
  Administered 2015-07-26: 20 mg via INTRAVENOUS
  Administered 2015-07-26: 10 mg via INTRAVENOUS

## 2015-07-26 MED ORDER — FENTANYL CITRATE (PF) 100 MCG/2ML IJ SOLN
INTRAMUSCULAR | Status: DC | PRN
  Administered 2015-07-26: 50 ug via INTRAVENOUS
  Administered 2015-07-26: 100 ug via INTRAVENOUS
  Administered 2015-07-26 (×2): 50 ug via INTRAVENOUS

## 2015-07-26 MED ORDER — CLINDAMYCIN PHOSPHATE 900 MG/50ML IV SOLN
900.0000 mg | INTRAVENOUS | Status: DC
  Filled 2015-07-26: qty 50

## 2015-07-26 MED ORDER — NEOSTIGMINE METHYLSULFATE 10 MG/10ML IV SOLN
INTRAVENOUS | Status: DC | PRN
  Administered 2015-07-26: 5 mg via INTRAVENOUS

## 2015-07-26 MED ORDER — SUCCINYLCHOLINE CHLORIDE 20 MG/ML IJ SOLN
INTRAMUSCULAR | Status: DC | PRN
  Administered 2015-07-26: 100 mg via INTRAVENOUS

## 2015-07-26 MED ORDER — LACTATED RINGERS IV SOLN
INTRAVENOUS | Status: DC | PRN
  Administered 2015-07-26 (×2): via INTRAVENOUS

## 2015-07-26 MED ORDER — OXYCODONE-ACETAMINOPHEN 5-325 MG PO TABS: 1.0000 | ORAL_TABLET | ORAL | Status: DC | PRN

## 2015-07-26 MED ORDER — BISACODYL 10 MG RE SUPP: 10.0000 mg | Freq: Every day | RECTAL | Status: DC | PRN

## 2015-07-26 MED ORDER — ONDANSETRON HCL 4 MG/2ML IJ SOLN: 4.0000 mg | INTRAMUSCULAR | Status: DC | PRN

## 2015-07-26 MED ORDER — ROCURONIUM BROMIDE 50 MG/5ML IV SOLN
INTRAVENOUS | Status: AC
  Filled 2015-07-26: qty 1

## 2015-07-26 MED ORDER — ALBUTEROL SULFATE (2.5 MG/3ML) 0.083% IN NEBU
INHALATION_SOLUTION | RESPIRATORY_TRACT | Status: AC
  Filled 2015-07-26: qty 3

## 2015-07-26 MED ORDER — MENTHOL 3 MG MT LOZG
1.0000 | LOZENGE | OROMUCOSAL | Status: DC | PRN
Start: 2015-07-26 — End: 2015-07-26

## 2015-07-26 MED ORDER — FENTANYL CITRATE (PF) 250 MCG/5ML IJ SOLN
INTRAMUSCULAR | Status: AC
  Filled 2015-07-26: qty 5

## 2015-07-26 MED ORDER — CLINDAMYCIN PHOSPHATE 900 MG/50ML IV SOLN
INTRAVENOUS | Status: DC | PRN
Start: 2015-07-26 — End: 2015-07-26
  Administered 2015-07-26: 900 mg via INTRAVENOUS

## 2015-07-26 MED ORDER — IPRATROPIUM-ALBUTEROL 0.5-2.5 (3) MG/3ML IN SOLN
RESPIRATORY_TRACT | Status: AC
  Filled 2015-07-26: qty 3

## 2015-07-26 MED ORDER — DOCUSATE SODIUM 100 MG PO CAPS: 100.0000 mg | ORAL_CAPSULE | Freq: Two times a day (BID) | ORAL | Status: DC

## 2015-07-26 MED ORDER — SODIUM CHLORIDE 0.9 % IJ SOLN: 3.0000 mL | Freq: Two times a day (BID) | INTRAMUSCULAR | Status: DC

## 2015-07-26 MED ORDER — OXYCODONE-ACETAMINOPHEN 10-325 MG PO TABS
1.0000 | ORAL_TABLET | ORAL | Status: DC | PRN
Start: 2015-07-26 — End: 2016-10-15

## 2015-07-26 MED ORDER — MIDAZOLAM HCL 5 MG/5ML IJ SOLN
INTRAMUSCULAR | Status: DC | PRN
Start: 2015-07-26 — End: 2015-07-26
  Administered 2015-07-26: 2 mg via INTRAVENOUS

## 2015-07-26 MED ORDER — IPRATROPIUM-ALBUTEROL 0.5-2.5 (3) MG/3ML IN SOLN
3.0000 mL | RESPIRATORY_TRACT | Status: DC | PRN
  Filled 2015-07-26: qty 3

## 2015-07-26 MED ORDER — HYDROMORPHONE HCL 1 MG/ML IJ SOLN
0.2500 mg | INTRAMUSCULAR | Status: DC | PRN
  Administered 2015-07-26 (×2): 0.5 mg via INTRAVENOUS

## 2015-07-26 MED ORDER — GLYCOPYRROLATE 0.2 MG/ML IJ SOLN
INTRAMUSCULAR | Status: DC | PRN
  Administered 2015-07-26: .8 mg via INTRAVENOUS

## 2015-07-26 MED ORDER — ACETAMINOPHEN 325 MG PO TABS: 650.0000 mg | ORAL_TABLET | ORAL | Status: DC | PRN

## 2015-07-26 MED ORDER — LIDOCAINE HCL (CARDIAC) 20 MG/ML IV SOLN
INTRAVENOUS | Status: DC | PRN
  Administered 2015-07-26: 60 mg via INTRAVENOUS
  Administered 2015-07-26 (×2): 50 mg via INTRAVENOUS

## 2015-07-26 MED ORDER — SENNA 8.6 MG PO TABS: 1.0000 | ORAL_TABLET | Freq: Two times a day (BID) | ORAL | Status: DC

## 2015-07-26 MED ORDER — LIDOCAINE-EPINEPHRINE 1 %-1:100000 IJ SOLN
INTRAMUSCULAR | Status: DC | PRN
  Administered 2015-07-26: 16 mL

## 2015-07-26 MED ORDER — HYDROMORPHONE HCL 1 MG/ML IJ SOLN
INTRAMUSCULAR | Status: AC
  Filled 2015-07-26: qty 1

## 2015-07-26 MED ORDER — ONDANSETRON HCL 4 MG/2ML IJ SOLN
INTRAMUSCULAR | Status: AC
  Filled 2015-07-26: qty 2

## 2015-07-26 SURGICAL SUPPLY — 58 items
APL SKNCLS STERI-STRIP NONHPOA (GAUZE/BANDAGES/DRESSINGS)
BAG DECANTER FOR FLEXI CONT (MISCELLANEOUS) ×3 IMPLANT
BENZOIN TINCTURE PRP APPL 2/3 (GAUZE/BANDAGES/DRESSINGS) IMPLANT
BLADE CLIPPER SURG (BLADE) IMPLANT
BLADE SURG 11 STRL SS (BLADE) ×3 IMPLANT
CANISTER SUCT 3000ML PPV (MISCELLANEOUS) ×3 IMPLANT
CLOSURE WOUND 1/2 X4 (GAUZE/BANDAGES/DRESSINGS)
DECANTER SPIKE VIAL GLASS SM (MISCELLANEOUS) ×3 IMPLANT
DRAPE CAMERA VIDEO/LASER (DRAPES) ×2 IMPLANT
DRAPE LAPAROTOMY 100X72 PEDS (DRAPES) ×3 IMPLANT
DRAPE MICROSCOPE LEICA (MISCELLANEOUS) IMPLANT
DRAPE POUCH INSTRU U-SHP 10X18 (DRAPES) ×3 IMPLANT
DRAPE PROXIMA HALF (DRAPES) IMPLANT
DRSG OPSITE POSTOP 3X4 (GAUZE/BANDAGES/DRESSINGS) ×1 IMPLANT
DRSG TEGADERM 4X4.75 (GAUZE/BANDAGES/DRESSINGS) ×6 IMPLANT
DURAPREP 6ML APPLICATOR 50/CS (WOUND CARE) ×3 IMPLANT
ELECT COATED BLADE 2.86 ST (ELECTRODE) ×3 IMPLANT
ELECT REM PT RETURN 9FT ADLT (ELECTROSURGICAL) ×3
ELECTRODE REM PT RTRN 9FT ADLT (ELECTROSURGICAL) ×1 IMPLANT
GAUZE SPONGE 4X4 16PLY XRAY LF (GAUZE/BANDAGES/DRESSINGS) ×2 IMPLANT
GENERATOR MODEL 106 ASPIRE (Neuro Prosthesis/Implant) ×2 IMPLANT
GLOVE BIOGEL PI IND STRL 7.5 (GLOVE) ×1 IMPLANT
GLOVE BIOGEL PI INDICATOR 7.5 (GLOVE) ×2
GLOVE ECLIPSE 7.0 STRL STRAW (GLOVE) ×3 IMPLANT
GLOVE EXAM NITRILE LRG STRL (GLOVE) IMPLANT
GLOVE EXAM NITRILE MD LF STRL (GLOVE) IMPLANT
GLOVE EXAM NITRILE XL STR (GLOVE) IMPLANT
GLOVE EXAM NITRILE XS STR PU (GLOVE) IMPLANT
GOWN STRL REUS W/ TWL LRG LVL3 (GOWN DISPOSABLE) ×2 IMPLANT
GOWN STRL REUS W/ TWL XL LVL3 (GOWN DISPOSABLE) IMPLANT
GOWN STRL REUS W/TWL 2XL LVL3 (GOWN DISPOSABLE) IMPLANT
GOWN STRL REUS W/TWL LRG LVL3 (GOWN DISPOSABLE) ×6
GOWN STRL REUS W/TWL XL LVL3 (GOWN DISPOSABLE)
KIT BASIN OR (CUSTOM PROCEDURE TRAY) ×3 IMPLANT
KIT ROOM TURNOVER OR (KITS) ×3 IMPLANT
LEAD PERENNIAFLEX 2-3 304 (Neuro Prosthesis/Implant) ×2 IMPLANT
LIQUID BAND (GAUZE/BANDAGES/DRESSINGS) ×3 IMPLANT
LOOP VESSEL MAXI BLUE (MISCELLANEOUS) ×2 IMPLANT
LOOP VESSEL MINI RED (MISCELLANEOUS) IMPLANT
NDL HYPO 25X1 1.5 SAFETY (NEEDLE) ×1 IMPLANT
NEEDLE HYPO 25X1 1.5 SAFETY (NEEDLE) ×3 IMPLANT
NS IRRIG 1000ML POUR BTL (IV SOLUTION) ×3 IMPLANT
PACK LAMINECTOMY NEURO (CUSTOM PROCEDURE TRAY) ×3 IMPLANT
PAD ARMBOARD 7.5X6 YLW CONV (MISCELLANEOUS) ×9 IMPLANT
RUBBERBAND STERILE (MISCELLANEOUS) IMPLANT
SPONGE INTESTINAL PEANUT (DISPOSABLE) ×1 IMPLANT
SPONGE SURGIFOAM ABS GEL SZ50 (HEMOSTASIS) ×3 IMPLANT
STRIP CLOSURE SKIN 1/2X4 (GAUZE/BANDAGES/DRESSINGS) IMPLANT
SUT ETHILON 3 0 FSL (SUTURE) IMPLANT
SUT NURALON 4 0 TR CR/8 (SUTURE) ×3 IMPLANT
SUT SILK 2 0 (SUTURE)
SUT SILK 2-0 18XBRD TIE 12 (SUTURE) IMPLANT
SUT VIC AB 3-0 SH 8-18 (SUTURE) ×3 IMPLANT
SUT VICRYL 3-0 RB1 18 ABS (SUTURE) ×4 IMPLANT
TOWEL OR 17X24 6PK STRL BLUE (TOWEL DISPOSABLE) ×3 IMPLANT
TOWEL OR 17X26 10 PK STRL BLUE (TOWEL DISPOSABLE) ×3 IMPLANT
TUNNELING TOOL (MISCELLANEOUS) ×2 IMPLANT
WATER STERILE IRR 1000ML POUR (IV SOLUTION) ×3 IMPLANT

## 2015-07-26 NOTE — Op Note (Signed)
PREOP DIAGNOSIS: Medically intractable epilepsy   POSTOP DIAGNOSIS: Same  PROCEDURE: 1. Placement of left Vagal nerve stimulator  SURGEON: Dr. Lisbeth Renshaw, MD  ASSISTANT: Dr. Kathaleen Maser. Pool, MD  ANESTHESIA: General Endotracheal  EBL: 20cc  SPECIMENS: None  DRAINS: None  COMPLICATIONS: None immediate  CONDITION: Hemodynamically stable to PACU  HISTORY: Jeffrey Wilcox is a 49 y.o. male who was initially seen in the outpatient clinic as a referral from the neurologist for medically intractable epilepsy. The patient appeared to be a good candidate for the procedure. The risks and benefits of the surgery were reviewed in detail. After all questions were answered, informed consent was obtained.  PROCEDURE IN DETAIL: After informed consent was obtained and witnessed, the patient was brought to the operating room. After induction of general anesthesia, the patient was positioned on the operative table in the supine position. All pressure points were meticulously padded. Skin incisions were then marked out and prepped and draped in the usual sterile fashion.  After timeout was conducted, skin incision was infiltrated with local anesthetic with epinephrine. Skin incision was then made sharply, and Bovie electrocautery was used to dissect the subcutaneous tissue. The platysma muscle was identified and incised. The platysma was then undermined. The medial border of the sternocleidomastoid muscle was identified, and dissection was carried out utilizing natural tissue planes of the neck until the carotid sheath was identified. The jugular vein was initially identified and the carotid sheath was incised. The vagus nerve was then identified running between the carotid and jugular. The vagus nerve was circumferentially dissected for length of aproximately 3-1/2 cm. The infraclavicular incision was then made, and Bovie electrocautery was used to dissect the subcutaneous tissue. A subcutaneous pocket  was then made. A subcutaneous tunneler was then passed from the neck to the subclavicular incision. The vagal stimulator leads were then tunneled. The leads were then placed on the vagus nerve. A relaxing curl was then placed, and the leaves were tacked to the sternocleidomastoid muscle. The generator was then connected to the leads, and testing was done to confirm normal impedance, and good placement. The generator was then placed in the subcutaneous pocket. Wounds were then irrigated with copious amounts of normal saline irrigation. The platysma was closed using interrupted 3-0 Vicryl stitches. Subcutaneous layer and the infraclavicular incision was closed using interrupted 3-0 Vicryl stitches. Skin was then closed using interrupted 3-0 Vicryl, and a layer of Dermabond was applied. A final check of the system was again carried out which showed the system to be functioning normally, with good lead placement, and normal impedance.  At the end of the case all sponge, needle, and instrument counts were correct. The patient was then transferred to the stretcher, and taken to the postanesthesia care unit in stable hemodynamic condition. He was left intubated because of underlying pulmonary co morbidities including hx of smoking and likely COPD.

## 2015-07-26 NOTE — Anesthesia Postprocedure Evaluation (Signed)
  Anesthesia Post-op Note  Patient: Jeffrey Wilcox  Procedure(s) Performed: Procedure(s) with comments: VAGAL NERVE STIMULATOR PLACEMENT (N/A) - vagal nerve stimulator placement  Patient Location: PACU  Anesthesia Type:General  Level of Consciousness: awake  Airway and Oxygen Therapy: Patient Spontanous Breathing  Post-op Pain: mild  Post-op Assessment: Post-op Vital signs reviewed              Post-op Vital Signs: Reviewed  Last Vitals:  Filed Vitals:   07/26/15 1345  BP: 141/96  Pulse: 97  Temp:   Resp: 20    Complications: No apparent anesthesia complications. Await  Vassie Loll will evaluate patient for discharge from PACU.

## 2015-07-26 NOTE — Discharge Instructions (Signed)
Shower on Sunday  Activities as tolerate  Regular diet  See DR Conchita Paris in 2 weeks [phone]

## 2015-07-26 NOTE — Procedures (Signed)
Extubation Procedure Note  Patient Details:   Name: Jeffrey Wilcox DOB: 11/04/1966 MRN: [id number]   Airway Documentation:  Airway 7.5 mm (Active)  Secured at (cm) 22 cm 07/26/2015 10:13 AM  Measured From Lips 07/26/2015 10:13 AM  Secured Location Right 07/26/2015 10:13 AM  Secured By Pink Tape 07/26/2015 10:13 AM  Site Condition Dry 07/26/2015 10:13 AM    Evaluation  O2 sats: stable throughout Complications: No apparent complications Patient did tolerate procedure well. Bilateral Breath Sounds: Clear   Yes   Pt had positive cuff leak. Pt able to speak clearly post extubation. Placed on 4 Lpm.  Rayburn Felt 07/26/2015, 11:42 AM

## 2015-07-26 NOTE — Transfer of Care (Signed)
Immediate Anesthesia Transfer of Care Note  Patient: Jeffrey Wilcox  Procedure(s) Performed: Procedure(s) with comments: VAGAL NERVE STIMULATOR PLACEMENT (N/A) - vagal nerve stimulator placement  Patient Location: PACU  Anesthesia Type:General  Level of Consciousness: Patient remains intubated per anesthesia plan  Airway & Oxygen Therapy: Patient remains intubated per anesthesia plan  Post-op Assessment: Report given to RN and Post -op Vital signs reviewed and stable  Post vital signs: Reviewed and stable  Last Vitals:  Filed Vitals:   07/26/15 0605  BP: 134/79  Pulse: 88  Temp: 37.2 C  Resp: 20    Complications: No apparent anesthesia complications

## 2015-07-26 NOTE — Consult Note (Signed)
PULMONARY / CRITICAL CARE MEDICINE   Name: Jeffrey Wilcox MRN: [id number] DOB: Dec 25, 1965    ADMISSION DATE:  07/26/2015 CONSULTATION DATE:  9/16  REFERRING MD :  Anesthesia  CHIEF COMPLAINT:Resp distress  INITIAL PRESENTATION:   STUDIES:    SIGNIFICANT EVENTS: 9/16 post op resp distress   HISTORY OF PRESENT ILLNESS:   49 yo male, life long 1/2 ppd smoker with refractory seizures despite  Multiple po medications. He had a vagal stimulator implanted today(9/16) and required increased FIO2 to maintain saturations. No complications during intubation, he was bronchospastic post op and anesthesia asked PCCM to evaluate. We will extubate and if no complications he can go home today if OK with NS.   PAST MEDICAL HISTORY :   has a past medical history of Degenerative disc disease; Arthritis, rheumatoid; Degenerative disc disease; Anxiety; Hyperlipidemia; Enlarged prostate; Muscle spasm; Severe depression; Osteoarthritis; Chronic back pain; Shortness of breath dyspnea; Pneumonia (at age 61); History of bronchitis (many yrs ago); History of migraine; Seizures; Dizziness; Joint pain; Joint swelling; History of colon polyps; Slow urinary stream; and PTSD (post-traumatic stress disorder).  has past surgical history that includes Tonsillectomy; Multiple tooth extractions; Inguinal hernia repair (01/06/2012); and Colonoscopy. Prior to Admission medications   Medication Sig Start Date End Date Taking? Authorizing Provider  albuterol (PROVENTIL HFA;VENTOLIN HFA) 108 (90 BASE) MCG/ACT inhaler Inhale 1-2 puffs into the lungs every 6 (six) hours as needed for wheezing or shortness of breath.   Yes Historical Provider, MD  ALPRAZolam Prudy Feeler) 1 MG tablet Take 1 mg by mouth 4 (four) times daily as needed. For anxiety/sleep 09/22/13  Yes Historical Provider, MD  BRINTELLIX 20 MG TABS Take 20 mg by mouth at bedtime.  09/25/13  Yes Historical Provider, MD  cyclobenzaprine (FLEXERIL) 10 MG tablet Take 10 mg by  mouth 3 (three) times daily as needed. Muscle Spasms.   Yes Historical Provider, MD  diphenhydrAMINE (BENADRYL) 25 mg capsule Take 25 mg by mouth daily as needed for allergies.   Yes Historical Provider, MD  LamoTRIgine (LAMICTAL XR) 200 MG TB24 Take 1 tablet by mouth 2 (two) times daily.    Yes Historical Provider, MD  phenytoin (DILANTIN) 300 MG ER capsule Take 300 mg by mouth 2 (two) times daily.    Yes Historical Provider, MD  pregabalin (LYRICA) 75 MG capsule Take 75 mg by mouth 2 (two) times daily.   Yes Historical Provider, MD  rizatriptan (MAXALT-MLT) 10 MG disintegrating tablet Take 10 mg by mouth as needed for migraine. May repeat in 2 hours if needed   Yes Historical Provider, MD  simvastatin (ZOCOR) 20 MG tablet Take 20 mg by mouth at bedtime.    Yes Historical Provider, MD  Tamsulosin HCl (FLOMAX) 0.4 MG CAPS Take 0.4 mg by mouth at bedtime.    Yes Historical Provider, MD  tiotropium (SPIRIVA) 18 MCG inhalation capsule Place 18 mcg into inhaler and inhale daily.   Yes Historical Provider, MD   Allergies  Allergen Reactions  . Other Anaphylaxis and Rash    [place], roses   . Penicillins Other (See Comments)    Hallucination    FAMILY HISTORY:  is adopted. SOCIAL HISTORY:  reports that he has been smoking Cigarettes.  He has a 16.5 pack-year smoking history. He does not have any smokeless tobacco history on file. He reports that he uses illicit drugs (Marijuana). He reports that he does not drink alcohol.  REVIEW OF SYSTEMS: NA  SUBJECTIVE:   VITAL SIGNS: Temp:  [97.5  F (36.4 C)-98.9 F (37.2 C)] 97.5 F (36.4 C) (09/16 1013) Pulse Rate:  [81-101] 101 (09/16 1115) Resp:  [16-32] 16 (09/16 1115) BP: (110-145)/(62-90) 134/83 mmHg (09/16 1112) SpO2:  [92 %-98 %] 92 % (09/16 1119) FiO2 (%):  [50 %-100 %] 50 % (09/16 1119) Weight:  [227 lb 1.6 oz (103.012 kg)] 227 lb 1.6 oz (103.012 kg) (09/16 0605) HEMODYNAMICS:   VENTILATOR SETTINGS: Vent Mode:  [-] PRVC FiO2 (%):   [50 %-100 %] 50 % Set Rate:  [16 bmp] 16 bmp Vt Set:  [580 mL] 580 mL PEEP:  [5 cmH20] 5 cmH20 Plateau Pressure:  [19 cmH20] 19 cmH20 INTAKE / OUTPUT:  Intake/Output Summary (Last 24 hours) at 07/26/15 1127 Last data filed at 07/26/15 1014  Gross per 24 hour  Intake   1450 ml  Output      0 ml  Net   1450 ml    PHYSICAL EXAMINATION: General:  WNWD WM Neuro:  Awake and alert, follows commands HEENT:  No JVD Cardiovascular:  HSR RR Lungs:  Exp wheeze Abdomen:  Soft +bs Musculoskeletal: intact Skin: warm and dry  LABS:  CBC No results for input(s): WBC, HGB, HCT, PLT in the last 168 hours. Coag's No results for input(s): APTT, INR in the last 168 hours. BMET No results for input(s): NA, K, CL, CO2, BUN, CREATININE, GLUCOSE in the last 168 hours. Electrolytes No results for input(s): CALCIUM, MG, PHOS in the last 168 hours. Sepsis Markers No results for input(s): LATICACIDVEN, PROCALCITON, O2SATVEN in the last 168 hours. ABG  Recent Labs Lab 07/26/15 1046  PHART 7.289*  PCO2ART 50.6*  PO2ART 224*   Liver Enzymes No results for input(s): AST, ALT, ALKPHOS, BILITOT, ALBUMIN in the last 168 hours. Cardiac Enzymes No results for input(s): TROPONINI, PROBNP in the last 168 hours. Glucose No results for input(s): GLUCAP in the last 168 hours.  Imaging Dg Chest Port 1 View  07/26/2015   CLINICAL DATA:  Status post intubation.  EXAM: PORTABLE CHEST - 1 VIEW  COMPARISON:  July 04, 2008.  FINDINGS: There appears to be widening of the superior mediastinum, particularly on the left side. Endotracheal tube is in grossly good position with distal tip approximately 4 cm above the carina. No pneumothorax or pleural effusion is noted. Mild central pulmonary vascular congestion is noted as well as bibasilar interstitial densities which may represent some degree of pulmonary edema. The visualized skeletal structures are unremarkable.  IMPRESSION: Endotracheal tube in grossly good  position. Mild central pulmonary vascular congestion and possible pulmonary edema. Widening of the superior mediastinum is seen, particularly on the left side ; CT scan of the chest is recommended to evaluate for possible adenopathy.   Electronically Signed   By: Lupita Raider, M.D.   On: 07/26/2015 10:59     ASSESSMENT / PLAN:  PULMONARY OETT 9/16>>9/16 A: Extubated post vagal stimulator with period of hypoxia post op   P:   Extubate Observe for 6 hours if OK, dc home BD's as needed   NEUROLOGIC A:   Seizure history Post op sedation  Chronic pain P:   RASS goal: 1 DC diprivan   FAMILY  - Updates: No famly at bedside  - Inter-disciplinary family meet or Palliative Care meeting due by:  day 7    TODAY'S SUMMARY:  49 yo male, life long 1/2 ppd smoker with refractory seizures despite  Multiple po medications. He had a vagal stimulator implanted today(9/16) and required increased FIO2 to  maintain saturations. No complications during intubation, he was bronchospastic post op and anesthesia asked PCCM to evaluate. We will extubate and if no complications he can go home today if OK with NS.  Brett Canales Rosaelena Kemnitz ACNP Adolph Pollack PCCM Pager [phone] till 3 pm If no answer page [phone] 07/26/2015, 11:35 AM

## 2015-07-26 NOTE — Progress Notes (Addendum)
Patient states he would accept blood if needed and asked that previous refusal be removed from chart.  Spoke with Lafonda Mosses in blood bank who will also make a note that patient would accept blood if needed.

## 2015-07-26 NOTE — Anesthesia Preprocedure Evaluation (Addendum)
Anesthesia Evaluation  Patient identified by MRN, date of birth, ID band Patient awake    Reviewed: Allergy & Precautions, NPO status , Patient's Chart, lab work & pertinent test results  Airway Mallampati: II  TM Distance: >3 FB Neck ROM: Full    Dental   Pulmonary shortness of breath, pneumonia, Current Smoker,    breath sounds clear to auscultation       Cardiovascular  Rhythm:Regular Rate:Normal     Neuro/Psych    GI/Hepatic negative GI ROS, Neg liver ROS,   Endo/Other  negative endocrine ROS  Renal/GU negative Renal ROS     Musculoskeletal  (+) Arthritis ,   Abdominal   Peds  Hematology   Anesthesia Other Findings   Reproductive/Obstetrics                            Anesthesia Physical Anesthesia Plan  ASA: III  Anesthesia Plan: General   Post-op Pain Management:    Induction: Intravenous  Airway Management Planned: Oral ETT  Additional Equipment:   Intra-op Plan:   Post-operative Plan: Extubation in OR  Informed Consent: I have reviewed the patients History and Physical, chart, labs and discussed the procedure including the risks, benefits and alternatives for the proposed anesthesia with the patient or authorized representative who has indicated his/her understanding and acceptance.   Dental advisory given  Plan Discussed with: CRNA, Anesthesiologist and Surgeon  Anesthesia Plan Comments:        Anesthesia Quick Evaluation

## 2015-07-26 NOTE — Anesthesia Procedure Notes (Signed)
Procedure Name: Intubation Date/Time: 07/26/2015 7:41 AM Performed by: Dairl Ponder Pre-anesthesia Checklist: Patient identified, Timeout performed, Emergency Drugs available, Suction available and Patient being monitored Patient Re-evaluated:Patient Re-evaluated prior to inductionOxygen Delivery Method: Circle system utilized Preoxygenation: Pre-oxygenation with 100% oxygen Intubation Type: IV induction Laryngoscope Size: Mac and 4 Grade View: Grade I Tube type: Oral Tube size: 7.5 mm Number of attempts: 1 Airway Equipment and Method: Stylet Placement Confirmation: ETT inserted through vocal cords under direct vision,  positive ETCO2 and breath sounds checked- equal and bilateral Secured at: 23 cm Tube secured with: Tape Dental Injury: Teeth and Oropharynx as per pre-operative assessment

## 2015-07-26 NOTE — Discharge Summary (Signed)
  Physician Discharge Summary  Patient ID: Jeffrey Wilcox MRN: [id number] DOB/AGE: 48-Jan-1967 48 y.o.  Admit date: 07/26/2015 Discharge date: 07/26/2015  Admission Diagnoses: Medically intractable epilepsy  Discharge Diagnoses: Same Active Problems:   * No active hospital problems. *   Discharged Condition: Stable  Hospital Course:  [name] is a 49 y.o. male who underwent uncomplicated placement of left VNS. With his hx of smoking, he was left intubated into the PACU, was evaluated by PCCM, and then extubated. He remained neurologically and hemodynamically stable, and was discharged home.  Treatments: Surgery - Left VNS placement  Discharge Exam: Blood pressure 141/96, pulse 97, temperature 97.5 F (36.4 C), temperature source Oral, resp. rate 20, height 5\' 10"  (1.778 m), weight 103.012 kg (227 lb 1.6 oz), SpO2 93 %. Awake, alert, oriented Speech fluent, appropriate CN grossly intact 5/5 BUE/BLE Wound c/d/i  Follow-up: Follow-up in my office [facility] [phone]) in 2 weeks  Disposition: 01-Home or Self Care     Medication List    TAKE these medications        albuterol 108 (90 BASE) MCG/ACT inhaler  Commonly known as:  PROVENTIL HFA;VENTOLIN HFA  Inhale 1-2 puffs into the lungs every 6 (six) hours as needed for wheezing or shortness of breath.     ALPRAZolam 1 MG tablet  Commonly known as:  XANAX  Take 1 mg by mouth 4 (four) times daily as needed. For anxiety/sleep     BRINTELLIX 20 MG Tabs  Generic drug:  Vortioxetine HBr  Take 20 mg by mouth at bedtime.     cyclobenzaprine 10 MG tablet  Commonly known as:  FLEXERIL  Take 10 mg by mouth 3 (three) times daily as needed. Muscle Spasms.     diphenhydrAMINE 25 mg capsule  Commonly known as:  BENADRYL  Take 25 mg by mouth daily as needed for allergies.     LAMICTAL XR 200 MG Tb24  Generic drug:  LamoTRIgine XR  Take 1 tablet by mouth 2 (two) times daily.     oxyCODONE-acetaminophen 10-325 MG per tablet  Commonly known as:  PERCOCET  Take 1 tablet by mouth every 4 (four) hours as needed for pain.     phenytoin 300 MG ER capsule  Commonly known as:  DILANTIN  Take 300 mg by mouth 2 (two) times daily.     pregabalin 75 MG capsule  Commonly known as:  LYRICA  Take 75 mg by mouth 2 (two) times daily.     rizatriptan 10 MG disintegrating tablet  Commonly known as:  MAXALT-MLT  Take 10 mg by mouth as needed for migraine. May repeat in 2 hours if needed     simvastatin 20 MG tablet  Commonly known as:  ZOCOR  Take 20 mg by mouth at bedtime.     tamsulosin 0.4 MG Caps capsule  Commonly known as:  FLOMAX  Take 0.4 mg by mouth at bedtime.     tiotropium 18 MCG inhalation capsule  Commonly known as:  SPIRIVA  Place 18 mcg into inhaler and inhale daily.         [phone], C 07/26/2015, 2:36 PM

## 2015-07-26 NOTE — H&P (Signed)
CC:  Intractable epilepsy  HPI: Jeffrey Wilcox is a 49 y.o. male with a history of medically intractable epilepsy, currently treated with multiple AED including dilantin, lamictal, and lyrica. He continues to have about 3-4 SZ per month and presents today for placement of a VNS.  PMH: Past Medical History  Diagnosis Date  . Degenerative disc disease   . Arthritis, rheumatoid   . Degenerative disc disease   . Anxiety     takes Xanax daily as needed  . Hyperlipidemia     takes Simvastatin nightly  . Enlarged prostate     takes Flomax daily  . Muscle spasm     takes Flexeril daily as needed  . Severe depression     takes Brintellix daily  . Osteoarthritis   . Chronic back pain     DDD  . Shortness of breath dyspnea     with exertion;has Spiriva and Albuterol if needed  . Pneumonia at age 65    "walking"  . History of bronchitis many yrs ago  . History of migraine     last one a month ago-takes Maxalt if needed  . Seizures     takes Dilantin and Lamictal daily;last seizure was 2 months ago  . Dizziness     r/t seizures per pt  . Joint pain   . Joint swelling   . History of colon polyps     benign  . Slow urinary stream   . PTSD (post-traumatic stress disorder)     takes Lexapro daily    PSH: Past Surgical History  Procedure Laterality Date  . Tonsillectomy    . Multiple tooth extractions    . Inguinal hernia repair  01/06/2012    Procedure: HERNIA REPAIR INGUINAL ADULT;  Surgeon: Dalia Heading, MD;  Location: [facility];  Service: General;  Laterality: Right;  . Colonoscopy      SH: Social History  Substance Use Topics  . Smoking status: Current Every Day Smoker -- 0.50 packs/day for 33 years    Types: Cigarettes  . Smokeless tobacco: Not on file  . Alcohol Use: No     Comment: recovering alcoholic for 12 years    MEDS: Prior to Admission medications   Medication Sig Start Date End Date Taking? Authorizing Provider  albuterol (PROVENTIL HFA;VENTOLIN HFA)  108 (90 BASE) MCG/ACT inhaler Inhale 1-2 puffs into the lungs every 6 (six) hours as needed for wheezing or shortness of breath.   Yes Historical Provider, MD  ALPRAZolam Prudy Feeler) 1 MG tablet Take 1 mg by mouth 4 (four) times daily as needed. For anxiety/sleep 09/22/13  Yes Historical Provider, MD  BRINTELLIX 20 MG TABS Take 20 mg by mouth at bedtime.  09/25/13  Yes Historical Provider, MD  cyclobenzaprine (FLEXERIL) 10 MG tablet Take 10 mg by mouth 3 (three) times daily as needed. Muscle Spasms.   Yes Historical Provider, MD  diphenhydrAMINE (BENADRYL) 25 mg capsule Take 25 mg by mouth daily as needed for allergies.   Yes Historical Provider, MD  LamoTRIgine (LAMICTAL XR) 200 MG TB24 Take 1 tablet by mouth 2 (two) times daily.    Yes Historical Provider, MD  phenytoin (DILANTIN) 300 MG ER capsule Take 300 mg by mouth 2 (two) times daily.    Yes Historical Provider, MD  pregabalin (LYRICA) 75 MG capsule Take 75 mg by mouth 2 (two) times daily.   Yes Historical Provider, MD  rizatriptan (MAXALT-MLT) 10 MG disintegrating tablet Take 10 mg by mouth as needed  for migraine. May repeat in 2 hours if needed   Yes Historical Provider, MD  simvastatin (ZOCOR) 20 MG tablet Take 20 mg by mouth at bedtime.    Yes Historical Provider, MD  Tamsulosin HCl (FLOMAX) 0.4 MG CAPS Take 0.4 mg by mouth at bedtime.    Yes Historical Provider, MD  tiotropium (SPIRIVA) 18 MCG inhalation capsule Place 18 mcg into inhaler and inhale daily.   Yes Historical Provider, MD    ALLERGY: Allergies  Allergen Reactions  . Other Anaphylaxis and Rash    [place], roses   . Penicillins Other (See Comments)    Hallucination    ROS: ROS  NEUROLOGIC EXAM: Awake, alert, oriented Memory and concentration grossly intact Speech fluent, appropriate CN grossly intact Motor exam: Upper Extremities Deltoid Bicep Tricep Grip  Right 5/5 5/5 5/5 5/5  Left 5/5 5/5 5/5 5/5   Lower Extremity IP Quad PF DF EHL  Right 5/5 5/5 5/5 5/5 5/5   Left 5/5 5/5 5/5 5/5 5/5   Sensation grossly intact to LT   IMPRESSION: - 49 y.o. male with medically intractable epilepsy  PLAN: - Proceed with left VNS placement - Likely home post-procedure  I have reviewed the risks, benefits, and alternatives to VNS placement with the patient in the office. After all questions were answered, he provided consent to proceed.

## 2015-07-29 ENCOUNTER — Encounter (HOSPITAL_COMMUNITY): Payer: Self-pay | Admitting: Neurosurgery

## 2015-08-12 DIAGNOSIS — Z79899 Other long term (current) drug therapy: Secondary | ICD-10-CM | POA: Diagnosis not present

## 2015-08-12 DIAGNOSIS — G40919 Epilepsy, unspecified, intractable, without status epilepticus: Secondary | ICD-10-CM | POA: Diagnosis not present

## 2015-09-04 DIAGNOSIS — J449 Chronic obstructive pulmonary disease, unspecified: Secondary | ICD-10-CM | POA: Diagnosis not present

## 2015-09-04 DIAGNOSIS — E785 Hyperlipidemia, unspecified: Secondary | ICD-10-CM | POA: Diagnosis not present

## 2015-09-04 DIAGNOSIS — Z79899 Other long term (current) drug therapy: Secondary | ICD-10-CM | POA: Diagnosis not present

## 2015-09-04 DIAGNOSIS — R569 Unspecified convulsions: Secondary | ICD-10-CM | POA: Diagnosis not present

## 2015-09-04 DIAGNOSIS — E669 Obesity, unspecified: Secondary | ICD-10-CM | POA: Diagnosis not present

## 2015-09-04 DIAGNOSIS — F17290 Nicotine dependence, other tobacco product, uncomplicated: Secondary | ICD-10-CM | POA: Diagnosis not present

## 2015-09-10 DIAGNOSIS — Z79899 Other long term (current) drug therapy: Secondary | ICD-10-CM | POA: Diagnosis not present

## 2015-09-10 DIAGNOSIS — R52 Pain, unspecified: Secondary | ICD-10-CM | POA: Diagnosis not present

## 2015-09-10 DIAGNOSIS — G40919 Epilepsy, unspecified, intractable, without status epilepticus: Secondary | ICD-10-CM | POA: Diagnosis not present

## 2015-09-10 DIAGNOSIS — G43711 Chronic migraine without aura, intractable, with status migrainosus: Secondary | ICD-10-CM | POA: Diagnosis not present

## 2015-10-08 DIAGNOSIS — G40909 Epilepsy, unspecified, not intractable, without status epilepticus: Secondary | ICD-10-CM | POA: Diagnosis not present

## 2015-10-08 DIAGNOSIS — M25519 Pain in unspecified shoulder: Secondary | ICD-10-CM | POA: Diagnosis not present

## 2015-10-08 DIAGNOSIS — G894 Chronic pain syndrome: Secondary | ICD-10-CM | POA: Diagnosis not present

## 2015-10-08 DIAGNOSIS — M545 Low back pain: Secondary | ICD-10-CM | POA: Diagnosis not present

## 2015-10-08 DIAGNOSIS — Z79899 Other long term (current) drug therapy: Secondary | ICD-10-CM | POA: Diagnosis not present

## 2015-10-29 DIAGNOSIS — Z79899 Other long term (current) drug therapy: Secondary | ICD-10-CM | POA: Diagnosis not present

## 2015-10-29 DIAGNOSIS — F3181 Bipolar II disorder: Secondary | ICD-10-CM | POA: Diagnosis not present

## 2015-11-06 DIAGNOSIS — Z79899 Other long term (current) drug therapy: Secondary | ICD-10-CM | POA: Diagnosis not present

## 2015-11-06 DIAGNOSIS — G40909 Epilepsy, unspecified, not intractable, without status epilepticus: Secondary | ICD-10-CM | POA: Diagnosis not present

## 2015-11-06 DIAGNOSIS — M545 Low back pain: Secondary | ICD-10-CM | POA: Diagnosis not present

## 2015-11-06 DIAGNOSIS — G894 Chronic pain syndrome: Secondary | ICD-10-CM | POA: Diagnosis not present

## 2015-11-06 DIAGNOSIS — M25519 Pain in unspecified shoulder: Secondary | ICD-10-CM | POA: Diagnosis not present

## 2015-12-23 DIAGNOSIS — J449 Chronic obstructive pulmonary disease, unspecified: Secondary | ICD-10-CM | POA: Diagnosis not present

## 2015-12-23 DIAGNOSIS — R569 Unspecified convulsions: Secondary | ICD-10-CM | POA: Diagnosis not present

## 2016-01-06 DIAGNOSIS — G40919 Epilepsy, unspecified, intractable, without status epilepticus: Secondary | ICD-10-CM | POA: Diagnosis not present

## 2016-01-06 DIAGNOSIS — G4459 Other complicated headache syndrome: Secondary | ICD-10-CM | POA: Diagnosis not present

## 2016-01-06 DIAGNOSIS — Z79899 Other long term (current) drug therapy: Secondary | ICD-10-CM | POA: Diagnosis not present

## 2016-02-25 DIAGNOSIS — Z79899 Other long term (current) drug therapy: Secondary | ICD-10-CM | POA: Diagnosis not present

## 2016-02-25 DIAGNOSIS — M545 Low back pain: Secondary | ICD-10-CM | POA: Diagnosis not present

## 2016-02-25 DIAGNOSIS — G40919 Epilepsy, unspecified, intractable, without status epilepticus: Secondary | ICD-10-CM | POA: Diagnosis not present

## 2016-03-25 DIAGNOSIS — M545 Low back pain: Secondary | ICD-10-CM | POA: Diagnosis not present

## 2016-03-25 DIAGNOSIS — G40919 Epilepsy, unspecified, intractable, without status epilepticus: Secondary | ICD-10-CM | POA: Diagnosis not present

## 2016-03-25 DIAGNOSIS — M25519 Pain in unspecified shoulder: Secondary | ICD-10-CM | POA: Diagnosis not present

## 2016-03-25 DIAGNOSIS — Z79899 Other long term (current) drug therapy: Secondary | ICD-10-CM | POA: Diagnosis not present

## 2016-05-06 ENCOUNTER — Other Ambulatory Visit (HOSPITAL_COMMUNITY): Payer: Self-pay | Admitting: Neurology

## 2016-05-06 ENCOUNTER — Ambulatory Visit (HOSPITAL_COMMUNITY)
Admission: RE | Admit: 2016-05-06 | Discharge: 2016-05-06 | Disposition: A | Discharge status: Home / Self Care | Payer: Medicare Other | Source: Ambulatory Visit | Attending: Neurology | Admitting: Neurology

## 2016-05-06 DIAGNOSIS — M545 Low back pain: Secondary | ICD-10-CM | POA: Diagnosis not present

## 2016-05-06 DIAGNOSIS — G8929 Other chronic pain: Secondary | ICD-10-CM | POA: Insufficient documentation

## 2016-05-06 DIAGNOSIS — M25512 Pain in left shoulder: Secondary | ICD-10-CM | POA: Diagnosis not present

## 2016-05-06 DIAGNOSIS — G40909 Epilepsy, unspecified, not intractable, without status epilepticus: Secondary | ICD-10-CM | POA: Diagnosis not present

## 2016-05-06 DIAGNOSIS — M25519 Pain in unspecified shoulder: Secondary | ICD-10-CM | POA: Diagnosis not present

## 2016-05-06 DIAGNOSIS — Z79899 Other long term (current) drug therapy: Secondary | ICD-10-CM | POA: Diagnosis not present

## 2016-06-10 DIAGNOSIS — K219 Gastro-esophageal reflux disease without esophagitis: Secondary | ICD-10-CM | POA: Diagnosis not present

## 2016-06-10 DIAGNOSIS — J449 Chronic obstructive pulmonary disease, unspecified: Secondary | ICD-10-CM | POA: Diagnosis not present

## 2016-06-10 DIAGNOSIS — R569 Unspecified convulsions: Secondary | ICD-10-CM | POA: Diagnosis not present

## 2016-06-10 DIAGNOSIS — F17290 Nicotine dependence, other tobacco product, uncomplicated: Secondary | ICD-10-CM | POA: Diagnosis not present

## 2016-06-11 DIAGNOSIS — G40909 Epilepsy, unspecified, not intractable, without status epilepticus: Secondary | ICD-10-CM | POA: Diagnosis not present

## 2016-06-11 DIAGNOSIS — M545 Low back pain: Secondary | ICD-10-CM | POA: Diagnosis not present

## 2016-06-11 DIAGNOSIS — M25519 Pain in unspecified shoulder: Secondary | ICD-10-CM | POA: Diagnosis not present

## 2016-06-11 DIAGNOSIS — Z79899 Other long term (current) drug therapy: Secondary | ICD-10-CM | POA: Diagnosis not present

## 2016-09-15 ENCOUNTER — Other Ambulatory Visit (HOSPITAL_COMMUNITY): Payer: Self-pay | Admitting: Neurology

## 2016-09-15 ENCOUNTER — Other Ambulatory Visit (HOSPITAL_COMMUNITY): Payer: Self-pay | Admitting: Student

## 2016-09-15 DIAGNOSIS — M25512 Pain in left shoulder: Principal | ICD-10-CM

## 2016-09-15 DIAGNOSIS — G8929 Other chronic pain: Secondary | ICD-10-CM

## 2016-09-23 ENCOUNTER — Ambulatory Visit (HOSPITAL_COMMUNITY): Admission: RE | Admit: 2016-09-23 | Payer: Medicare Other | Source: Ambulatory Visit

## 2016-10-07 ENCOUNTER — Encounter: Payer: Self-pay | Admitting: Orthopaedic Surgery

## 2016-10-07 ENCOUNTER — Ambulatory Visit: Payer: Medicare Other | Admitting: Orthopaedic Surgery

## 2016-10-12 ENCOUNTER — Ambulatory Visit (HOSPITAL_COMMUNITY)
Admission: RE | Admit: 2016-10-12 | Discharge: 2016-10-12 | Disposition: A | Discharge status: Home / Self Care | Payer: Medicare Other | Source: Ambulatory Visit | Attending: Neurology | Admitting: Neurology

## 2016-10-12 DIAGNOSIS — G8929 Other chronic pain: Secondary | ICD-10-CM | POA: Diagnosis not present

## 2016-10-12 DIAGNOSIS — M25512 Pain in left shoulder: Secondary | ICD-10-CM

## 2016-10-12 DIAGNOSIS — M19012 Primary osteoarthritis, left shoulder: Secondary | ICD-10-CM | POA: Diagnosis not present

## 2016-10-15 ENCOUNTER — Ambulatory Visit (INDEPENDENT_AMBULATORY_CARE_PROVIDER_SITE_OTHER): Payer: Medicare Other | Admitting: Orthopaedic Surgery

## 2016-10-15 ENCOUNTER — Encounter: Payer: Self-pay | Admitting: Orthopaedic Surgery

## 2016-10-15 VITALS — BP 124/85 | HR 96 | Temp 98.1°F | Ht 72.0 in | Wt 241.0 lb

## 2016-10-15 DIAGNOSIS — F17210 Nicotine dependence, cigarettes, uncomplicated: Secondary | ICD-10-CM

## 2016-10-15 DIAGNOSIS — G8929 Other chronic pain: Secondary | ICD-10-CM | POA: Diagnosis not present

## 2016-10-15 DIAGNOSIS — M25512 Pain in left shoulder: Secondary | ICD-10-CM | POA: Diagnosis not present

## 2016-10-15 MED ORDER — HYDROCODONE-ACETAMINOPHEN 7.5-325 MG PO TABS: ORAL_TABLET | ORAL | 0 refills | Status: DC

## 2016-10-15 NOTE — Patient Instructions (Signed)
Steps to Quit Smoking Smoking tobacco can be bad for your health. It can also affect almost every organ in your body. Smoking puts you and people around you at risk for many serious long-lasting (chronic) diseases. Quitting smoking is hard, but it is one of the best things that you can do for your health. It is never too late to quit. What are the benefits of quitting smoking? When you quit smoking, you lower your risk for getting serious diseases and conditions. They can include:  Lung cancer or lung disease.  Heart disease.  Stroke.  Heart attack.  Not being able to have children (infertility).  Weak bones (osteoporosis) and broken bones (fractures). If you have coughing, wheezing, and shortness of breath, those symptoms may get better when you quit. You may also get sick less often. If you are pregnant, quitting smoking can help to lower your chances of having a baby of low birth weight. What can I do to help me quit smoking? Talk with your doctor about what can help you quit smoking. Some things you can do (strategies) include:  Quitting smoking totally, instead of slowly cutting back how much you smoke over a period of time.  Going to in-person counseling. You are more likely to quit if you go to many counseling sessions.  Using resources and support systems, such as:  Online chats with a counselor.  Phone quitlines.  Printed self-help materials.  Support groups or group counseling.  Text messaging programs.  Mobile phone apps or applications.  Taking medicines. Some of these medicines may have nicotine in them. If you are pregnant or breastfeeding, do not take any medicines to quit smoking unless your doctor says it is okay. Talk with your doctor about counseling or other things that can help you. Talk with your doctor about using more than one strategy at the same time, such as taking medicines while you are also going to in-person counseling. This can help make quitting  easier. What things can I do to make it easier to quit? Quitting smoking might feel very hard at first, but there is a lot that you can do to make it easier. Take these steps:  Talk to your family and friends. Ask them to support and encourage you.  Call phone quitlines, reach out to support groups, or work with a counselor.  Ask people who smoke to not smoke around you.  Avoid places that make you want (trigger) to smoke, such as:  Bars.  Parties.  Smoke-break areas at work.  Spend time with people who do not smoke.  Lower the stress in your life. Stress can make you want to smoke. Try these things to help your stress:  Getting regular exercise.  Deep-breathing exercises.  Yoga.  Meditating.  Doing a body scan. To do this, close your eyes, focus on one area of your body at a time from head to toe, and notice which parts of your body are tense. Try to relax the muscles in those areas.  Download or buy apps on your mobile phone or tablet that can help you stick to your quit plan. There are many free apps, such as QuitGuide from the CDC (Centers for Disease Control and Prevention). You can find more support from smokefree.gov and other websites. This information is not intended to replace advice given to you by your health care provider. Make sure you discuss any questions you have with your health care provider. Document Released: 08/22/2009 Document Revised: 06/23/2016 Document   Reviewed: 03/12/2015 Elsevier Interactive Patient Education  2017 Elsevier Inc.  

## 2016-10-15 NOTE — Progress Notes (Signed)
Subjective:    Patient ID: Jeffrey Wilcox, male    DOB: [birth year], 50 y.o.   MRN: [id number]  HPI He has had increasing pain over the last several months of the left shoulder. He had seizures and doing one of the latest seizures her hurt his left shoulder.  He has no paresthesias.  He has no swelling or redness.  He has seen Dr. Gerilyn Pilgrim.  He has had x-rays and CT scan of the left shoulder showing: IMPRESSION: 1.  No acute osseous injury of the left shoulder. 2. Mild osteoarthritis of the left glenohumeral joint.  The CT was done 10-12-16.  His pain continues and he is referred here.   He cannot take NSAIDs.  He has tried heat, ice and rest with little help.  Tylenol does not help.  He smokes and has cut back from three packs a day to one-half pack a day.  He is encouraged to stop.  Review of Systems  HENT: Negative for congestion.   Respiratory: Positive for cough and shortness of breath.   Cardiovascular: Negative for chest pain and leg swelling.  Endocrine: Negative for cold intolerance.  Musculoskeletal: Positive for arthralgias, back pain, joint swelling, myalgias and neck pain.  Allergic/Immunologic: Negative for environmental allergies.  Neurological: Positive for seizures and headaches.  Psychiatric/Behavioral: The patient is nervous/anxious.    Past Medical History:  Diagnosis Date  . Anxiety    takes Xanax daily as needed  . Arthritis, rheumatoid (HCC)   . Chronic back pain    DDD  . Degenerative disc disease   . Degenerative disc disease   . Dizziness    r/t seizures per pt  . Enlarged prostate    takes Flomax daily  . History of bronchitis many yrs ago  . History of colon polyps    benign  . History of migraine    last one a month ago-takes Maxalt if needed  . Hyperlipidemia    takes Simvastatin nightly  . Joint pain   . Joint swelling   . Muscle spasm    takes Flexeril daily as needed  . Osteoarthritis   . Pneumonia at age 62   "walking"  . PTSD  (post-traumatic stress disorder)    takes Lexapro daily  . Seizures (HCC)    takes Dilantin and Lamictal daily;last seizure was 2 months ago  . Severe depression (HCC)    takes Brintellix daily  . Shortness of breath dyspnea    with exertion;has Spiriva and Albuterol if needed  . Slow urinary stream     Past Surgical History:  Procedure Laterality Date  . COLONOSCOPY    . INGUINAL HERNIA REPAIR  01/06/2012   Procedure: HERNIA REPAIR INGUINAL ADULT;  Surgeon: Dalia Heading, MD;  Location: [facility];  Service: General;  Laterality: Right;  . MULTIPLE TOOTH EXTRACTIONS    . TONSILLECTOMY    . VAGUS NERVE STIMULATOR INSERTION N/A 07/26/2015   Procedure: VAGAL NERVE STIMULATOR PLACEMENT;  Surgeon: Lisbeth Renshaw, MD;  Location: [facility];  Service: Neurosurgery;  Laterality: N/A;  vagal nerve stimulator placement    Current Outpatient Prescriptions on File Prior to Visit  Medication Sig Dispense Refill  . albuterol (PROVENTIL HFA;VENTOLIN HFA) 108 (90 BASE) MCG/ACT inhaler Inhale 1-2 puffs into the lungs every 6 (six) hours as needed for wheezing or shortness of breath.    . ALPRAZolam (XANAX) 1 MG tablet Take 1 mg by mouth 4 (four) times daily as needed. For anxiety/sleep    .  BRINTELLIX 20 MG TABS Take 20 mg by mouth at bedtime.     . cyclobenzaprine (FLEXERIL) 10 MG tablet Take 10 mg by mouth 3 (three) times daily as needed. Muscle Spasms.    . diphenhydrAMINE (BENADRYL) 25 mg capsule Take 25 mg by mouth daily as needed for allergies.    . LamoTRIgine (LAMICTAL XR) 200 MG TB24 Take 1 tablet by mouth 2 (two) times daily.     . phenytoin (DILANTIN) 300 MG ER capsule Take 300 mg by mouth 2 (two) times daily.     . pregabalin (LYRICA) 75 MG capsule Take 75 mg by mouth 2 (two) times daily.    . rizatriptan (MAXALT-MLT) 10 MG disintegrating tablet Take 10 mg by mouth as needed for migraine. May repeat in 2 hours if needed    . simvastatin (ZOCOR) 20 MG tablet Take 20 mg by mouth at  bedtime.     . Tamsulosin HCl (FLOMAX) 0.4 MG CAPS Take 0.4 mg by mouth at bedtime.     Marland Kitchen tiotropium (SPIRIVA) 18 MCG inhalation capsule Place 18 mcg into inhaler and inhale daily.     No current facility-administered medications on file prior to visit.     Social History   Social History  . Marital status: Divorced    Spouse name: N/A  . Number of children: N/A  . Years of education: N/A   Occupational History  . Not on file.   Social History Main Topics  . Smoking status: Current Every Day Smoker    Packs/day: 0.50    Years: 33.00    Types: Cigarettes  . Smokeless tobacco: Not on file  . Alcohol use No     Comment: recovering alcoholic for 12 years  . Drug use:     Types: Marijuana     Comment: a week ago  . Sexual activity: Not Currently   Other Topics Concern  . Not on file   Social History Narrative  . No narrative on file    Family History  Problem Relation Age of Onset  . Adopted: Yes    BP 124/85   Pulse 96   Temp 98.1 F (36.7 C)   Ht 6' (1.829 m)   Wt 241 lb (109.3 kg)   BMI 32.69 kg/m      Objective:   Physical Exam  Constitutional: He is oriented to person, place, and time. He appears well-developed and well-nourished.  HENT:  Head: Normocephalic and atraumatic.  Eyes: Conjunctivae and EOM are normal. Pupils are equal, round, and reactive to light.  Neck: Normal range of motion. Neck supple.  Cardiovascular: Normal rate, regular rhythm and intact distal pulses.   Pulmonary/Chest: Effort normal.  Abdominal: Soft.  Musculoskeletal: He exhibits tenderness (Pain left shoulder, abduction 75, flexion 100, internal 20, external 20, extension 10, adduction full, no effusion, NV intact. Neck full motion.  Right shoulder negative.).       Left shoulder: He exhibits decreased range of motion and tenderness.       Arms: Neurological: He is alert and oriented to person, place, and time. He has normal reflexes. He displays normal reflexes. No cranial  nerve deficit. He exhibits normal muscle tone. Coordination normal.  Skin: Skin is warm and dry.  Psychiatric: He has a normal mood and affect. His behavior is normal. Judgment and thought content normal.          Assessment & Plan:   Encounter Diagnoses  Name Primary?  . Chronic left shoulder pain Yes  .  Cigarette nicotine dependence without complication    PROCEDURE NOTE:  The patient request injection, verbal consent was obtained.  The left shoulder was prepped appropriately after time out was performed.   Sterile technique was observed and injection of 1 cc of Depo-Medrol 40 mg with several cc's of plain xylocaine. Anesthesia was provided by ethyl chloride and a 20-gauge needle was used to inject the shoulder area. A posterior approach was used.  The injection was tolerated well.  A band aid dressing was applied.  The patient was advised to apply ice later today and tomorrow to the injection sight as needed.  He may need CT scan arthrogram of the left shoulder to see if he has rotator cuff tear.    Return 10-28-16.  Call if any problem.  Rx for pain given.  Precautions discussed.  Electronically Signed Darreld Mclean, MD 12/7/20173:39 PM

## 2016-10-20 ENCOUNTER — Ambulatory Visit: Payer: Medicare Other | Admitting: Orthopaedic Surgery

## 2016-10-28 ENCOUNTER — Ambulatory Visit (INDEPENDENT_AMBULATORY_CARE_PROVIDER_SITE_OTHER): Payer: Medicare Other | Admitting: Orthopaedic Surgery

## 2016-10-28 ENCOUNTER — Encounter: Payer: Self-pay | Admitting: Orthopaedic Surgery

## 2016-10-28 VITALS — BP 124/87 | HR 85 | Temp 97.5°F | Ht 72.0 in | Wt 241.0 lb

## 2016-10-28 DIAGNOSIS — M25512 Pain in left shoulder: Secondary | ICD-10-CM

## 2016-10-28 DIAGNOSIS — F17210 Nicotine dependence, cigarettes, uncomplicated: Secondary | ICD-10-CM

## 2016-10-28 DIAGNOSIS — G8929 Other chronic pain: Secondary | ICD-10-CM | POA: Diagnosis not present

## 2016-10-28 MED ORDER — HYDROCODONE-ACETAMINOPHEN 7.5-325 MG PO TABS: ORAL_TABLET | ORAL | 0 refills | Status: DC

## 2016-10-28 NOTE — Patient Instructions (Signed)
Steps to Quit Smoking Smoking tobacco can be bad for your health. It can also affect almost every organ in your body. Smoking puts you and people around you at risk for many serious Jeffrey Wilcox-lasting (chronic) diseases. Quitting smoking is hard, but it is one of the best things that you can do for your health. It is never too late to quit. What are the benefits of quitting smoking? When you quit smoking, you lower your risk for getting serious diseases and conditions. They can include:  Lung cancer or lung disease.  Heart disease.  Stroke.  Heart attack.  Not being able to have children (infertility).  Weak bones (osteoporosis) and broken bones (fractures). If you have coughing, wheezing, and shortness of breath, those symptoms may get better when you quit. You may also get sick less often. If you are pregnant, quitting smoking can help to lower your chances of having a baby of low birth weight. What can I do to help me quit smoking? Talk with your doctor about what can help you quit smoking. Some things you can do (strategies) include:  Quitting smoking totally, instead of slowly cutting back how much you smoke over a period of time.  Going to in-person counseling. You are more likely to quit if you go to many counseling sessions.  Using resources and support systems, such as:  Online chats with a counselor.  Phone quitlines.  Printed self-help materials.  Support groups or group counseling.  Text messaging programs.  Mobile phone apps or applications.  Taking medicines. Some of these medicines may have nicotine in them. If you are pregnant or breastfeeding, do not take any medicines to quit smoking unless your doctor says it is okay. Talk with your doctor about counseling or other things that can help you. Talk with your doctor about using more than one strategy at the same time, such as taking medicines while you are also going to in-person counseling. This can help make quitting  easier. What things can I do to make it easier to quit? Quitting smoking might feel very hard at first, but there is a lot that you can do to make it easier. Take these steps:  Talk to your family and friends. Ask them to support and encourage you.  Call phone quitlines, reach out to support groups, or work with a counselor.  Ask people who smoke to not smoke around you.  Avoid places that make you want (trigger) to smoke, such as:  Bars.  Parties.  Smoke-break areas at work.  Spend time with people who do not smoke.  Lower the stress in your life. Stress can make you want to smoke. Try these things to help your stress:  Getting regular exercise.  Deep-breathing exercises.  Yoga.  Meditating.  Doing a body scan. To do this, close your eyes, focus on one area of your body at a time from head to toe, and notice which parts of your body are tense. Try to relax the muscles in those areas.  Download or buy apps on your mobile phone or tablet that can help you stick to your quit plan. There are many free apps, such as QuitGuide from the CDC (Centers for Disease Control and Prevention). You can find more support from smokefree.gov and other websites. This information is not intended to replace advice given to you by your health care provider. Make sure you discuss any questions you have with your health care provider. Document Released: 08/22/2009 Document Revised: 06/23/2016 Document   Reviewed: 03/12/2015 Elsevier Interactive Patient Education  2017 Elsevier Inc.  

## 2016-10-28 NOTE — Progress Notes (Signed)
PROCEDURE NOTE:  The patient request injection, verbal consent was obtained.  The left shoulder was prepped appropriately after time out was performed.   Sterile technique was observed and injection of 1 cc of Depo-Medrol 40 mg with several cc's of plain xylocaine. Anesthesia was provided by ethyl chloride and a 20-gauge needle was used to inject the shoulder area. A posterior approach was used.  The injection was tolerated well.  A band aid dressing was applied.  The patient was advised to apply ice later today and tomorrow to the injection sight as needed.  I have set him up for CT arthrogram of the left shoulder to see if he has rotator cuff tear.  He cannot have MRI secondary to implanted device.  Return after CT arthrogram.  Call if any problem.  Electronically Signed Darreld Mclean, MD 12/20/201710:21 AM

## 2016-11-09 HISTORY — PX: VAGUS NERVE STIMULATOR INSERTION: SHX348

## 2016-11-11 ENCOUNTER — Telehealth: Payer: Self-pay | Admitting: Orthopaedic Surgery

## 2016-11-11 NOTE — Telephone Encounter (Signed)
Jeffrey Wilcox called and I relayed your message to him regarding CT Arthrogram on Monday and an appointment back to see Dr. Hilda Lias on Wednesday and the times for each appointment.

## 2016-11-16 ENCOUNTER — Inpatient Hospital Stay (HOSPITAL_COMMUNITY): Admission: RE | Admit: 2016-11-16 | Payer: Medicare Other | Source: Ambulatory Visit

## 2016-11-16 ENCOUNTER — Ambulatory Visit (HOSPITAL_COMMUNITY): Admission: RE | Admit: 2016-11-16 | Payer: Medicare Other | Source: Ambulatory Visit

## 2016-11-18 ENCOUNTER — Telehealth: Payer: Self-pay | Admitting: Orthopaedic Surgery

## 2016-11-18 NOTE — Telephone Encounter (Signed)
Patient of Dr. Sanjuan Dame requests a refill of Hydrocodone/Acetaminophen (Norco) 7.5-325 mgs.   Qty  56  Sig: One every four hours for pain as needed. Do not drive car or operate machinery while taking this medicine. Must last 14 days.

## 2016-11-19 ENCOUNTER — Other Ambulatory Visit: Payer: Self-pay | Admitting: *Deleted

## 2016-11-19 MED ORDER — HYDROCODONE-ACETAMINOPHEN 7.5-325 MG PO TABS: ORAL_TABLET | ORAL | 0 refills | Status: DC

## 2016-11-20 ENCOUNTER — Encounter: Payer: Self-pay | Admitting: Orthopedic Surgery

## 2016-11-20 NOTE — Progress Notes (Signed)
[place] controlled substance reporting system reviewed  

## 2016-11-26 ENCOUNTER — Ambulatory Visit (HOSPITAL_COMMUNITY): Admission: RE | Admit: 2016-11-26 | Payer: Medicare Other | Source: Ambulatory Visit

## 2016-11-26 ENCOUNTER — Ambulatory Visit (HOSPITAL_COMMUNITY): Payer: Medicare Other

## 2016-11-27 ENCOUNTER — Ambulatory Visit (HOSPITAL_COMMUNITY)
Admission: RE | Admit: 2016-11-27 | Discharge: 2016-11-27 | Disposition: A | Discharge status: Home / Self Care | Payer: Medicare Other | Source: Ambulatory Visit | Attending: Orthopaedic Surgery | Admitting: Orthopaedic Surgery

## 2016-11-27 DIAGNOSIS — G8929 Other chronic pain: Secondary | ICD-10-CM | POA: Diagnosis not present

## 2016-11-27 DIAGNOSIS — M19012 Primary osteoarthritis, left shoulder: Secondary | ICD-10-CM | POA: Insufficient documentation

## 2016-11-27 DIAGNOSIS — M25512 Pain in left shoulder: Secondary | ICD-10-CM | POA: Diagnosis not present

## 2016-11-27 DIAGNOSIS — R937 Abnormal findings on diagnostic imaging of other parts of musculoskeletal system: Secondary | ICD-10-CM | POA: Insufficient documentation

## 2016-11-27 MED ORDER — IOPAMIDOL (ISOVUE-300) INJECTION 61%
50.0000 mL | Freq: Once | INTRAVENOUS | Status: AC | PRN
  Administered 2016-11-27: 11 mL

## 2016-11-27 MED ORDER — LIDOCAINE HCL (PF) 1 % IJ SOLN
INTRAMUSCULAR | Status: AC
  Filled 2016-11-27: qty 15

## 2016-11-27 MED ORDER — IOPAMIDOL (ISOVUE-300) INJECTION 61%
INTRAVENOUS | Status: AC
  Filled 2016-11-27: qty 50

## 2016-12-01 ENCOUNTER — Encounter: Payer: Self-pay | Admitting: Orthopedic Surgery

## 2016-12-01 ENCOUNTER — Encounter: Payer: Self-pay | Admitting: Orthopaedic Surgery

## 2016-12-01 ENCOUNTER — Ambulatory Visit: Payer: Medicare Other | Admitting: Orthopaedic Surgery

## 2016-12-02 ENCOUNTER — Ambulatory Visit (INDEPENDENT_AMBULATORY_CARE_PROVIDER_SITE_OTHER): Payer: Medicare Other | Admitting: Orthopaedic Surgery

## 2016-12-02 ENCOUNTER — Encounter: Payer: Self-pay | Admitting: Orthopaedic Surgery

## 2016-12-02 VITALS — BP 136/95 | HR 89 | Temp 98.1°F | Ht 72.0 in | Wt 234.0 lb

## 2016-12-02 DIAGNOSIS — M25512 Pain in left shoulder: Secondary | ICD-10-CM | POA: Diagnosis not present

## 2016-12-02 DIAGNOSIS — F17210 Nicotine dependence, cigarettes, uncomplicated: Secondary | ICD-10-CM | POA: Diagnosis not present

## 2016-12-02 DIAGNOSIS — G8929 Other chronic pain: Secondary | ICD-10-CM | POA: Diagnosis not present

## 2016-12-02 MED ORDER — HYDROCODONE-ACETAMINOPHEN 7.5-325 MG PO TABS: ORAL_TABLET | ORAL | 0 refills | Status: DC

## 2016-12-02 NOTE — Progress Notes (Signed)
Patient Jeffrey Wilcox, male DOB:05-12-1966, 51 y.o. XBL:[id number]  Chief Complaint  Patient presents with  . Follow-up    review Left shoulder CT    HPI  Jeffrey Wilcox is a 51 y.o. male who has had pain in the left shoulder.  He had CT arthrogram done on 11-27-16 showing: IMPRESSION: 1. Thickening of the anterior inferior labrum most consistent with prior injury and scarring. Subtle concavity of the superior posterolateral humeral head as can be seen with a old Hill-Sachs lesion. These findings can be seen with history of prior dislocation. 2. No rotator cuff tear of the left shoulder. 3. Mild osteoarthritis of the left glenohumeral joint.  I have explained the findings. I would not recommend any surgery.  Continue present medicine and exercises. HPI  Body mass index is 31.74 kg/m.  ROS  Review of Systems  HENT: Negative for congestion.   Respiratory: Positive for cough and shortness of breath.   Cardiovascular: Negative for chest pain and leg swelling.  Endocrine: Negative for cold intolerance.  Musculoskeletal: Positive for arthralgias, back pain, joint swelling, myalgias and neck pain.  Allergic/Immunologic: Negative for environmental allergies.  Neurological: Positive for seizures and headaches.  Psychiatric/Behavioral: The patient is nervous/anxious.     Past Medical History:  Diagnosis Date  . Anxiety    takes Xanax daily as needed  . Arthritis, rheumatoid (HCC)   . Chronic back pain    DDD  . Degenerative disc disease   . Degenerative disc disease   . Dizziness    r/t seizures per pt  . Enlarged prostate    takes Flomax daily  . History of bronchitis many yrs ago  . History of colon polyps    benign  . History of migraine    last one a month ago-takes Maxalt if needed  . Hyperlipidemia    takes Simvastatin nightly  . Joint pain   . Joint swelling   . Muscle spasm    takes Flexeril daily as needed  . Osteoarthritis   . Pneumonia at age 62    "walking"  . PTSD (post-traumatic stress disorder)    takes Lexapro daily  . Seizures (HCC)    takes Dilantin and Lamictal daily;last seizure was 2 months ago  . Severe depression (HCC)    takes Brintellix daily  . Shortness of breath dyspnea    with exertion;has Spiriva and Albuterol if needed  . Slow urinary stream     Past Surgical History:  Procedure Laterality Date  . COLONOSCOPY    . INGUINAL HERNIA REPAIR  01/06/2012   Procedure: HERNIA REPAIR INGUINAL ADULT;  Surgeon: Dalia Heading, MD;  Location: [facility];  Service: General;  Laterality: Right;  . MULTIPLE TOOTH EXTRACTIONS    . TONSILLECTOMY    . VAGUS NERVE STIMULATOR INSERTION N/A 07/26/2015   Procedure: VAGAL NERVE STIMULATOR PLACEMENT;  Surgeon: Lisbeth Renshaw, MD;  Location: [facility];  Service: Neurosurgery;  Laterality: N/A;  vagal nerve stimulator placement    Family History  Problem Relation Age of Onset  . Adopted: Yes    Social History Social History  Substance Use Topics  . Smoking status: Current Every Day Smoker    Packs/day: 0.50    Years: 33.00    Types: Cigarettes  . Smokeless tobacco: Former Neurosurgeon  . Alcohol use No     Comment: recovering alcoholic for 12 years    Allergies  Allergen Reactions  . Other Anaphylaxis and Rash    [place], roses   .  Penicillins Other (See Comments)    Hallucination    Current Outpatient Prescriptions  Medication Sig Dispense Refill  . albuterol (PROVENTIL HFA;VENTOLIN HFA) 108 (90 BASE) MCG/ACT inhaler Inhale 1-2 puffs into the lungs every 6 (six) hours as needed for wheezing or shortness of breath.    . ALPRAZolam (XANAX) 1 MG tablet Take 1 mg by mouth 4 (four) times daily as needed. For anxiety/sleep    . BRINTELLIX 20 MG TABS Take 20 mg by mouth at bedtime.     . cyclobenzaprine (FLEXERIL) 10 MG tablet Take 10 mg by mouth 3 (three) times daily as needed. Muscle Spasms.    . diphenhydrAMINE (BENADRYL) 25 mg capsule Take 25 mg by mouth daily as needed  for allergies.    Marland Kitchen HYDROcodone-acetaminophen (NORCO) 7.5-325 MG tablet One every four hours for pain as needed.  Do not drive car or operate machinery while taking this medicine.  Must last 14 days. 56 tablet 0  . LamoTRIgine (LAMICTAL XR) 200 MG TB24 Take 1 tablet by mouth 2 (two) times daily.     . phenytoin (DILANTIN) 300 MG ER capsule Take 300 mg by mouth 2 (two) times daily.     . pregabalin (LYRICA) 75 MG capsule Take 75 mg by mouth 2 (two) times daily.    . rizatriptan (MAXALT-MLT) 10 MG disintegrating tablet Take 10 mg by mouth as needed for migraine. May repeat in 2 hours if needed    . simvastatin (ZOCOR) 20 MG tablet Take 20 mg by mouth at bedtime.     . Tamsulosin HCl (FLOMAX) 0.4 MG CAPS Take 0.4 mg by mouth at bedtime.     Marland Kitchen tiotropium (SPIRIVA) 18 MCG inhalation capsule Place 18 mcg into inhaler and inhale daily.     No current facility-administered medications for this visit.      Physical Exam  Blood pressure (!) 136/95, pulse 89, temperature 98.1 F (36.7 C), height 6' (1.829 m), weight 234 lb (106.1 kg).  Constitutional: overall normal hygiene, normal nutrition, well developed, normal grooming, normal body habitus. Assistive device:none  Musculoskeletal: gait and station Limp none, muscle tone and strength are normal, no tremors or atrophy is present.  .  Neurological: coordination overall normal.  Deep tendon reflex/nerve stretch intact.  Sensation normal.  Cranial nerves II-XII intact.   Skin:   Normal overall no scars, lesions, ulcers or rashes. No psoriasis.  Psychiatric: Alert and oriented x 3.  Recent memory intact, remote memory unclear.  Normal mood and affect. Well groomed.  Good eye contact.  Cardiovascular: overall no swelling, no varicosities, no edema bilaterally, normal temperatures of the legs and arms, no clubbing, cyanosis and good capillary refill.  Lymphatic: palpation is normal.   Examination of left Upper Extremity is  done.  Inspection:   Overall:  Elbow non-tender without crepitus or defects, forearm non-tender without crepitus or defects, wrist non-tender without crepitus or defects, hand non-tender.    Shoulder: with glenohumeral joint tenderness, without effusion.   Upper arm: without swelling and tenderness   Range of motion:   Overall:  Full range of motion of the elbow, full range of motion of wrist and full range of motion in fingers.   Shoulder:  left  165 degrees forward flexion; 145 degrees abduction; 30 degrees internal rotation, 30 degrees external rotation, 15 degrees extension, 40 degrees adduction.   Stability:   Overall:  Shoulder, elbow and wrist stable   Strength and Tone:   Overall full shoulder muscles strength, full upper  arm strength and normal upper arm bulk and tone.  The patient has been educated about the nature of the problem(s) and counseled on treatment options.  The patient appeared to understand what I have discussed and is in agreement with it.  Encounter Diagnoses  Name Primary?  . Chronic left shoulder pain Yes  . Cigarette nicotine dependence without complication     PLAN Call if any problems.  Precautions discussed.  Continue current medications.   Return to clinic 1 month   Pain medicine given after review of state narcotic web site.  Electronically Signed Darreld Mclean, MD 1/24/20182:53 PM

## 2016-12-02 NOTE — Patient Instructions (Addendum)
Continue medications and exercise.      Steps to Quit Smoking Smoking tobacco can be bad for your health. It can also affect almost every organ in your body. Smoking puts you and people around you at risk for many serious long-lasting (chronic) diseases. Quitting smoking is hard, but it is one of the best things that you can do for your health. It is never too late to quit. What are the benefits of quitting smoking? When you quit smoking, you lower your risk for getting serious diseases and conditions. They can include:  Lung cancer or lung disease.  Heart disease.  Stroke.  Heart attack.  Not being able to have children (infertility).  Weak bones (osteoporosis) and broken bones (fractures). If you have coughing, wheezing, and shortness of breath, those symptoms may get better when you quit. You may also get sick less often. If you are pregnant, quitting smoking can help to lower your chances of having a baby of low birth weight. What can I do to help me quit smoking? Talk with your doctor about what can help you quit smoking. Some things you can do (strategies) include:  Quitting smoking totally, instead of slowly cutting back how much you smoke over a period of time.  Going to in-person counseling. You are more likely to quit if you go to many counseling sessions.  Using resources and support systems, such as:  Online chats with a Veterinary surgeon.  Phone quitlines.  Printed Materials engineer.  Support groups or group counseling.  Text messaging programs.  Mobile phone apps or applications.  Taking medicines. Some of these medicines may have nicotine in them. If you are pregnant or breastfeeding, do not take any medicines to quit smoking unless your doctor says it is okay. Talk with your doctor about counseling or other things that can help you. Talk with your doctor about using more than one strategy at the same time, such as taking medicines while you are also going to  in-person counseling. This can help make quitting easier. What things can I do to make it easier to quit? Quitting smoking might feel very hard at first, but there is a lot that you can do to make it easier. Take these steps:  Talk to your family and friends. Ask them to support and encourage you.  Call phone quitlines, reach out to support groups, or work with a Veterinary surgeon.  Ask people who smoke to not smoke around you.  Avoid places that make you want (trigger) to smoke, such as:  Bars.  Parties.  Smoke-break areas at work.  Spend time with people who do not smoke.  Lower the stress in your life. Stress can make you want to smoke. Try these things to help your stress:  Getting regular exercise.  Deep-breathing exercises.  Yoga.  Meditating.  Doing a body scan. To do this, close your eyes, focus on one area of your body at a time from head to toe, and notice which parts of your body are tense. Try to relax the muscles in those areas.  Download or buy apps on your mobile phone or tablet that can help you stick to your quit plan. There are many free apps, such as QuitGuide from the [employer] developer for Disease Control and Prevention). You can find more support from smokefree.gov and other websites. This information is not intended to replace advice given to you by your health care provider. Make sure you discuss any questions you have with your health  care provider. Document Released: 08/22/2009 Document Revised: 06/23/2016 Document Reviewed: 03/12/2015 Elsevier Interactive Patient Education  2017 [employer].

## 2016-12-10 DIAGNOSIS — Z79899 Other long term (current) drug therapy: Secondary | ICD-10-CM | POA: Diagnosis not present

## 2016-12-10 DIAGNOSIS — G894 Chronic pain syndrome: Secondary | ICD-10-CM | POA: Diagnosis not present

## 2016-12-10 DIAGNOSIS — G4459 Other complicated headache syndrome: Secondary | ICD-10-CM | POA: Diagnosis not present

## 2016-12-10 DIAGNOSIS — M25519 Pain in unspecified shoulder: Secondary | ICD-10-CM | POA: Diagnosis not present

## 2016-12-21 ENCOUNTER — Telehealth: Payer: Self-pay | Admitting: Orthopaedic Surgery

## 2016-12-21 MED ORDER — HYDROCODONE-ACETAMINOPHEN 7.5-325 MG PO TABS: ORAL_TABLET | ORAL | 0 refills | Status: DC

## 2017-01-05 ENCOUNTER — Ambulatory Visit (INDEPENDENT_AMBULATORY_CARE_PROVIDER_SITE_OTHER): Payer: Medicare Other | Admitting: Orthopaedic Surgery

## 2017-01-05 VITALS — BP 152/96 | HR 107 | Temp 96.8°F | Ht 72.0 in | Wt 230.0 lb

## 2017-01-05 DIAGNOSIS — G8929 Other chronic pain: Secondary | ICD-10-CM

## 2017-01-05 DIAGNOSIS — F17210 Nicotine dependence, cigarettes, uncomplicated: Secondary | ICD-10-CM

## 2017-01-05 DIAGNOSIS — M25512 Pain in left shoulder: Secondary | ICD-10-CM

## 2017-01-05 MED ORDER — HYDROCODONE-ACETAMINOPHEN 7.5-325 MG PO TABS: ORAL_TABLET | ORAL | 0 refills | Status: DC

## 2017-01-05 NOTE — Progress Notes (Signed)
PROCEDURE NOTE:  The patient request injection, verbal consent was obtained.  The left shoulder was prepped appropriately after time out was performed.   Sterile technique was observed and injection of 1 cc of Depo-Medrol 40 mg with several cc's of plain xylocaine. Anesthesia was provided by ethyl chloride and a 20-gauge needle was used to inject the shoulder area. A posterior approach was used.  The injection was tolerated well.  A band aid dressing was applied.  The patient was advised to apply ice later today and tomorrow to the injection sight as needed.  He continues to smoke and not cutting back.  Encounter Diagnoses  Name Primary?  . Chronic left shoulder pain Yes  . Cigarette nicotine dependence without complication    Return in one month.  Call if any problem.  Continue shoulder exercises at home.  Precautions discussed.  Electronically Signed Darreld Mclean, MD 2/27/20182:25 PM

## 2017-01-07 DIAGNOSIS — M545 Low back pain: Secondary | ICD-10-CM | POA: Diagnosis not present

## 2017-01-07 DIAGNOSIS — Z79899 Other long term (current) drug therapy: Secondary | ICD-10-CM | POA: Diagnosis not present

## 2017-01-07 DIAGNOSIS — G894 Chronic pain syndrome: Secondary | ICD-10-CM | POA: Diagnosis not present

## 2017-01-07 DIAGNOSIS — M25519 Pain in unspecified shoulder: Secondary | ICD-10-CM | POA: Diagnosis not present

## 2017-01-26 ENCOUNTER — Telehealth: Payer: Self-pay | Admitting: Orthopaedic Surgery

## 2017-01-26 MED ORDER — HYDROCODONE-ACETAMINOPHEN 7.5-325 MG PO TABS: ORAL_TABLET | ORAL | 0 refills | Status: DC

## 2017-01-26 NOTE — Telephone Encounter (Signed)
Patient said he sent request through mychart but I did not see it.  He requests refill on Hydrocodone/Acetaminophen  7.5-325   mgs.   Qty  50  Sig: One every six hours for pain as needed. Do not drive car or operate machinery while taking this medicine. Must last 14 days

## 2017-02-02 ENCOUNTER — Ambulatory Visit (INDEPENDENT_AMBULATORY_CARE_PROVIDER_SITE_OTHER): Payer: Medicare Other | Admitting: Orthopaedic Surgery

## 2017-02-02 ENCOUNTER — Encounter: Payer: Self-pay | Admitting: Orthopaedic Surgery

## 2017-02-02 VITALS — BP 117/80 | HR 96 | Temp 96.8°F | Ht 72.0 in | Wt 234.0 lb

## 2017-02-02 DIAGNOSIS — M25512 Pain in left shoulder: Secondary | ICD-10-CM | POA: Diagnosis not present

## 2017-02-02 DIAGNOSIS — F17210 Nicotine dependence, cigarettes, uncomplicated: Secondary | ICD-10-CM

## 2017-02-02 DIAGNOSIS — G8929 Other chronic pain: Secondary | ICD-10-CM

## 2017-02-02 NOTE — Progress Notes (Signed)
PROCEDURE NOTE:  The patient request injection, verbal consent was obtained.  The left shoulder was prepped appropriately after time out was performed.   Sterile technique was observed and injection of 1 cc of Depo-Medrol 40 mg with several cc's of plain xylocaine. Anesthesia was provided by ethyl chloride and a 20-gauge needle was used to inject the shoulder area. A posterior approach was used.  The injection was tolerated well.  A band aid dressing was applied.  The patient was advised to apply ice later today and tomorrow to the injection sight as needed.  Return in one month.  He continues to smoke and is not willing to stop at all.  Encounter Diagnoses  Name Primary?  . Chronic left shoulder pain Yes  . Cigarette nicotine dependence without complication    Electronically Signed Darreld Mclean, MD 3/27/20189:57 AM

## 2017-02-09 ENCOUNTER — Telehealth: Payer: Self-pay | Admitting: Orthopaedic Surgery

## 2017-02-09 NOTE — Telephone Encounter (Signed)
Call from patient, requests refill:  HYDROcodone-acetaminophen (NORCO) 7.5-325 MG tablet 30 tablet   - states unable to unlock MyChart; said will contact help desk, in order to request next time, electronically

## 2017-02-10 MED ORDER — HYDROCODONE-ACETAMINOPHEN 7.5-325 MG PO TABS: ORAL_TABLET | ORAL | 0 refills | Status: DC

## 2017-03-02 ENCOUNTER — Encounter: Payer: Self-pay | Admitting: Orthopaedic Surgery

## 2017-03-02 ENCOUNTER — Ambulatory Visit (INDEPENDENT_AMBULATORY_CARE_PROVIDER_SITE_OTHER): Payer: Medicare Other | Admitting: Orthopaedic Surgery

## 2017-03-02 VITALS — BP 138/88 | HR 94 | Temp 97.2°F | Ht 72.0 in | Wt 236.0 lb

## 2017-03-02 DIAGNOSIS — F17210 Nicotine dependence, cigarettes, uncomplicated: Secondary | ICD-10-CM

## 2017-03-02 DIAGNOSIS — G8929 Other chronic pain: Secondary | ICD-10-CM

## 2017-03-02 DIAGNOSIS — M25512 Pain in left shoulder: Secondary | ICD-10-CM | POA: Diagnosis not present

## 2017-03-02 MED ORDER — HYDROCODONE-ACETAMINOPHEN 7.5-325 MG PO TABS: ORAL_TABLET | ORAL | 0 refills | Status: DC

## 2017-03-02 NOTE — Progress Notes (Signed)
PROCEDURE NOTE:  The patient request injection, verbal consent was obtained.  The left shoulder was prepped appropriately after time out was performed.   Sterile technique was observed and injection of 1 cc of Depo-Medrol 40 mg with several cc's of plain xylocaine. Anesthesia was provided by ethyl chloride and a 20-gauge needle was used to inject the shoulder area. A posterior approach was used.  The injection was tolerated well.  A band aid dressing was applied.  The patient was advised to apply ice later today and tomorrow to the injection sight as needed.  Encounter Diagnoses  Name Primary?  . Chronic left shoulder pain Yes  . Cigarette nicotine dependence without complication    He continues to smoke but has cut back substantially.  I have reviewed the [place] Controlled Substance Reporting System web site prior to prescribing narcotic medicine for this patient.  Electronically Signed Darreld Mclean, MD 4/24/20189:25 AM

## 2017-03-02 NOTE — Patient Instructions (Addendum)
Steps to Quit Smoking Smoking tobacco can be bad for your health. It can also affect almost every organ in your body. Smoking puts you and people around you at risk for many serious Freddy Spadafora-lasting (chronic) diseases. Quitting smoking is hard, but it is one of the best things that you can do for your health. It is never too late to quit. What are the benefits of quitting smoking? When you quit smoking, you lower your risk for getting serious diseases and conditions. They can include:  Lung cancer or lung disease.  Heart disease.  Stroke.  Heart attack.  Not being able to have children (infertility).  Weak bones (osteoporosis) and broken bones (fractures). If you have coughing, wheezing, and shortness of breath, those symptoms may get better when you quit. You may also get sick less often. If you are pregnant, quitting smoking can help to lower your chances of having a baby of low birth weight. What can I do to help me quit smoking? Talk with your doctor about what can help you quit smoking. Some things you can do (strategies) include:  Quitting smoking totally, instead of slowly cutting back how much you smoke over a period of time.  Going to in-person counseling. You are more likely to quit if you go to many counseling sessions.  Using resources and support systems, such as:  Online chats with a counselor.  Phone quitlines.  Printed self-help materials.  Support groups or group counseling.  Text messaging programs.  Mobile phone apps or applications.  Taking medicines. Some of these medicines may have nicotine in them. If you are pregnant or breastfeeding, do not take any medicines to quit smoking unless your doctor says it is okay. Talk with your doctor about counseling or other things that can help you. Talk with your doctor about using more than one strategy at the same time, such as taking medicines while you are also going to in-person counseling. This can help make quitting  easier. What things can I do to make it easier to quit? Quitting smoking might feel very hard at first, but there is a lot that you can do to make it easier. Take these steps:  Talk to your family and friends. Ask them to support and encourage you.  Call phone quitlines, reach out to support groups, or work with a counselor.  Ask people who smoke to not smoke around you.  Avoid places that make you want (trigger) to smoke, such as:  Bars.  Parties.  Smoke-break areas at work.  Spend time with people who do not smoke.  Lower the stress in your life. Stress can make you want to smoke. Try these things to help your stress:  Getting regular exercise.  Deep-breathing exercises.  Yoga.  Meditating.  Doing a body scan. To do this, close your eyes, focus on one area of your body at a time from head to toe, and notice which parts of your body are tense. Try to relax the muscles in those areas.  Download or buy apps on your mobile phone or tablet that can help you stick to your quit plan. There are many free apps, such as QuitGuide from the CDC (Centers for Disease Control and Prevention). You can find more support from smokefree.gov and other websites. This information is not intended to replace advice given to you by your health care provider. Make sure you discuss any questions you have with your health care provider. Document Released: 08/22/2009 Document Revised: 06/23/2016 Document   Reviewed: 03/12/2015 Elsevier Interactive Patient Education  2017 Elsevier Inc.  

## 2017-03-16 DIAGNOSIS — Z79899 Other long term (current) drug therapy: Secondary | ICD-10-CM | POA: Diagnosis not present

## 2017-03-18 ENCOUNTER — Telehealth: Payer: Self-pay | Admitting: Orthopaedic Surgery

## 2017-03-18 MED ORDER — HYDROCODONE-ACETAMINOPHEN 7.5-325 MG PO TABS: ORAL_TABLET | ORAL | 0 refills | Status: DC

## 2017-03-18 NOTE — Telephone Encounter (Signed)
Hydrocodone-Acetaminophen  7.5/325 mg  Qty 20 Tablets °

## 2017-03-30 ENCOUNTER — Encounter: Payer: Self-pay | Admitting: Orthopaedic Surgery

## 2017-03-30 ENCOUNTER — Ambulatory Visit (INDEPENDENT_AMBULATORY_CARE_PROVIDER_SITE_OTHER): Payer: Medicare Other | Admitting: Orthopaedic Surgery

## 2017-03-30 VITALS — BP 128/92 | HR 87 | Temp 97.9°F | Ht 72.0 in | Wt 234.0 lb

## 2017-03-30 DIAGNOSIS — M25512 Pain in left shoulder: Secondary | ICD-10-CM | POA: Diagnosis not present

## 2017-03-30 DIAGNOSIS — G8929 Other chronic pain: Secondary | ICD-10-CM | POA: Diagnosis not present

## 2017-03-30 MED ORDER — HYDROCODONE-ACETAMINOPHEN 7.5-325 MG PO TABS: ORAL_TABLET | ORAL | 0 refills | Status: DC

## 2017-03-30 NOTE — Progress Notes (Signed)
PROCEDURE NOTE:  The patient request injection, verbal consent was obtained.  The left shoulder was prepped appropriately after time out was performed.   Sterile technique was observed and injection of 1 cc of Depo-Medrol 40 mg with several cc's of plain xylocaine. Anesthesia was provided by ethyl chloride and a 20-gauge needle was used to inject the shoulder area. A posterior approach was used.  The injection was tolerated well.  A band aid dressing was applied.  The patient was advised to apply ice later today and tomorrow to the injection sight as needed.  Encounter Diagnosis  Name Primary?  . Chronic left shoulder pain Yes   Return in one month.  Call if any problem.  Precautions discussed.  Electronically Signed Darreld Mclean, MD 5/22/201810:02 AM

## 2017-03-30 NOTE — Patient Instructions (Addendum)
Steps to Quit Smoking Smoking tobacco can be bad for your health. It can also affect almost every organ in your body. Smoking puts you and people around you at risk for many serious Jeffrey Wilcox-lasting (chronic) diseases. Quitting smoking is hard, but it is one of the best things that you can do for your health. It is never too late to quit. What are the benefits of quitting smoking? When you quit smoking, you lower your risk for getting serious diseases and conditions. They can include:  Lung cancer or lung disease.  Heart disease.  Stroke.  Heart attack.  Not being able to have children (infertility).  Weak bones (osteoporosis) and broken bones (fractures). If you have coughing, wheezing, and shortness of breath, those symptoms may get better when you quit. You may also get sick less often. If you are pregnant, quitting smoking can help to lower your chances of having a baby of low birth weight. What can I do to help me quit smoking? Talk with your doctor about what can help you quit smoking. Some things you can do (strategies) include:  Quitting smoking totally, instead of slowly cutting back how much you smoke over a period of time.  Going to in-person counseling. You are more likely to quit if you go to many counseling sessions.  Using resources and support systems, such as:  Online chats with a counselor.  Phone quitlines.  Printed self-help materials.  Support groups or group counseling.  Text messaging programs.  Mobile phone apps or applications.  Taking medicines. Some of these medicines may have nicotine in them. If you are pregnant or breastfeeding, do not take any medicines to quit smoking unless your doctor says it is okay. Talk with your doctor about counseling or other things that can help you. Talk with your doctor about using more than one strategy at the same time, such as taking medicines while you are also going to in-person counseling. This can help make quitting  easier. What things can I do to make it easier to quit? Quitting smoking might feel very hard at first, but there is a lot that you can do to make it easier. Take these steps:  Talk to your family and friends. Ask them to support and encourage you.  Call phone quitlines, reach out to support groups, or work with a counselor.  Ask people who smoke to not smoke around you.  Avoid places that make you want (trigger) to smoke, such as:  Bars.  Parties.  Smoke-break areas at work.  Spend time with people who do not smoke.  Lower the stress in your life. Stress can make you want to smoke. Try these things to help your stress:  Getting regular exercise.  Deep-breathing exercises.  Yoga.  Meditating.  Doing a body scan. To do this, close your eyes, focus on one area of your body at a time from head to toe, and notice which parts of your body are tense. Try to relax the muscles in those areas.  Download or buy apps on your mobile phone or tablet that can help you stick to your quit plan. There are many free apps, such as QuitGuide from the CDC (Centers for Disease Control and Prevention). You can find more support from smokefree.gov and other websites. This information is not intended to replace advice given to you by your health care provider. Make sure you discuss any questions you have with your health care provider. Document Released: 08/22/2009 Document Revised: 06/23/2016 Document   Reviewed: 03/12/2015 Elsevier Interactive Patient Education  2017 Elsevier Inc.  

## 2017-04-14 DIAGNOSIS — M545 Low back pain: Secondary | ICD-10-CM | POA: Diagnosis not present

## 2017-04-14 DIAGNOSIS — M25519 Pain in unspecified shoulder: Secondary | ICD-10-CM | POA: Diagnosis not present

## 2017-04-14 DIAGNOSIS — Z79899 Other long term (current) drug therapy: Secondary | ICD-10-CM | POA: Diagnosis not present

## 2017-04-14 DIAGNOSIS — G894 Chronic pain syndrome: Secondary | ICD-10-CM | POA: Diagnosis not present

## 2017-04-15 ENCOUNTER — Telehealth: Payer: Self-pay | Admitting: Orthopaedic Surgery

## 2017-04-15 NOTE — Telephone Encounter (Signed)
Patient requests refills on Hydrocodone/Acetaminophen (Norco)  7.5-325 mgs.   Qty  20  Sig: One every six hours for pain as needed. Do not drive car or operate machinery while taking this medicine. Must last 14 days.

## 2017-04-19 MED ORDER — HYDROCODONE-ACETAMINOPHEN 7.5-325 MG PO TABS: ORAL_TABLET | ORAL | 0 refills | Status: DC

## 2017-04-27 ENCOUNTER — Ambulatory Visit: Payer: Medicare Other | Admitting: Orthopaedic Surgery

## 2017-06-11 DIAGNOSIS — Z1389 Encounter for screening for other disorder: Secondary | ICD-10-CM | POA: Diagnosis not present

## 2017-06-11 DIAGNOSIS — K219 Gastro-esophageal reflux disease without esophagitis: Secondary | ICD-10-CM | POA: Diagnosis not present

## 2017-06-11 DIAGNOSIS — Z79899 Other long term (current) drug therapy: Secondary | ICD-10-CM | POA: Diagnosis not present

## 2017-06-11 DIAGNOSIS — Z0000 Encounter for general adult medical examination without abnormal findings: Secondary | ICD-10-CM | POA: Diagnosis not present

## 2017-06-11 DIAGNOSIS — J449 Chronic obstructive pulmonary disease, unspecified: Secondary | ICD-10-CM | POA: Diagnosis not present

## 2017-06-11 DIAGNOSIS — R569 Unspecified convulsions: Secondary | ICD-10-CM | POA: Diagnosis not present

## 2017-06-11 DIAGNOSIS — E785 Hyperlipidemia, unspecified: Secondary | ICD-10-CM | POA: Diagnosis not present

## 2017-06-30 DIAGNOSIS — Z79899 Other long term (current) drug therapy: Secondary | ICD-10-CM | POA: Diagnosis not present

## 2017-06-30 DIAGNOSIS — M25519 Pain in unspecified shoulder: Secondary | ICD-10-CM | POA: Diagnosis not present

## 2017-06-30 DIAGNOSIS — G894 Chronic pain syndrome: Secondary | ICD-10-CM | POA: Diagnosis not present

## 2017-06-30 DIAGNOSIS — M545 Low back pain: Secondary | ICD-10-CM | POA: Diagnosis not present

## 2017-08-26 DIAGNOSIS — Z79899 Other long term (current) drug therapy: Secondary | ICD-10-CM | POA: Diagnosis not present

## 2017-08-26 DIAGNOSIS — M545 Low back pain: Secondary | ICD-10-CM | POA: Diagnosis not present

## 2017-08-26 DIAGNOSIS — M25519 Pain in unspecified shoulder: Secondary | ICD-10-CM | POA: Diagnosis not present

## 2017-09-21 DIAGNOSIS — Z79899 Other long term (current) drug therapy: Secondary | ICD-10-CM | POA: Diagnosis not present

## 2017-10-07 DIAGNOSIS — M25519 Pain in unspecified shoulder: Secondary | ICD-10-CM | POA: Diagnosis not present

## 2017-10-07 DIAGNOSIS — G4459 Other complicated headache syndrome: Secondary | ICD-10-CM | POA: Diagnosis not present

## 2018-01-12 DIAGNOSIS — Z79899 Other long term (current) drug therapy: Secondary | ICD-10-CM | POA: Diagnosis not present

## 2018-01-12 DIAGNOSIS — G4459 Other complicated headache syndrome: Secondary | ICD-10-CM | POA: Diagnosis not present

## 2018-01-12 DIAGNOSIS — M25519 Pain in unspecified shoulder: Secondary | ICD-10-CM | POA: Diagnosis not present

## 2018-01-25 DIAGNOSIS — M549 Dorsalgia, unspecified: Secondary | ICD-10-CM | POA: Diagnosis not present

## 2018-01-25 DIAGNOSIS — Z1331 Encounter for screening for depression: Secondary | ICD-10-CM | POA: Diagnosis not present

## 2018-01-25 DIAGNOSIS — Z23 Encounter for immunization: Secondary | ICD-10-CM | POA: Diagnosis not present

## 2018-01-25 DIAGNOSIS — J449 Chronic obstructive pulmonary disease, unspecified: Secondary | ICD-10-CM | POA: Diagnosis not present

## 2018-01-25 DIAGNOSIS — E785 Hyperlipidemia, unspecified: Secondary | ICD-10-CM | POA: Diagnosis not present

## 2018-01-25 DIAGNOSIS — Z0000 Encounter for general adult medical examination without abnormal findings: Secondary | ICD-10-CM | POA: Diagnosis not present

## 2018-01-25 DIAGNOSIS — Z79899 Other long term (current) drug therapy: Secondary | ICD-10-CM | POA: Diagnosis not present

## 2018-01-25 DIAGNOSIS — Z1389 Encounter for screening for other disorder: Secondary | ICD-10-CM | POA: Diagnosis not present

## 2018-02-16 DIAGNOSIS — M25519 Pain in unspecified shoulder: Secondary | ICD-10-CM | POA: Diagnosis not present

## 2018-02-16 DIAGNOSIS — G4459 Other complicated headache syndrome: Secondary | ICD-10-CM | POA: Diagnosis not present

## 2018-04-20 DIAGNOSIS — G4459 Other complicated headache syndrome: Secondary | ICD-10-CM | POA: Diagnosis not present

## 2018-04-20 DIAGNOSIS — M25519 Pain in unspecified shoulder: Secondary | ICD-10-CM | POA: Diagnosis not present

## 2018-04-27 DIAGNOSIS — J449 Chronic obstructive pulmonary disease, unspecified: Secondary | ICD-10-CM | POA: Diagnosis not present

## 2018-04-27 DIAGNOSIS — M549 Dorsalgia, unspecified: Secondary | ICD-10-CM | POA: Diagnosis not present

## 2018-04-27 DIAGNOSIS — R042 Hemoptysis: Secondary | ICD-10-CM | POA: Diagnosis not present

## 2018-04-27 DIAGNOSIS — K219 Gastro-esophageal reflux disease without esophagitis: Secondary | ICD-10-CM | POA: Diagnosis not present

## 2018-05-10 ENCOUNTER — Ambulatory Visit (HOSPITAL_COMMUNITY)
Admission: RE | Admit: 2018-05-10 | Discharge: 2018-05-10 | Disposition: A | Discharge status: Home / Self Care | Payer: Medicare Other | Source: Ambulatory Visit | Attending: Internal Medicine | Admitting: Internal Medicine

## 2018-05-10 ENCOUNTER — Other Ambulatory Visit (HOSPITAL_COMMUNITY): Payer: Self-pay | Admitting: Internal Medicine

## 2018-05-10 DIAGNOSIS — R042 Hemoptysis: Secondary | ICD-10-CM | POA: Insufficient documentation

## 2018-05-10 DIAGNOSIS — R5381 Other malaise: Secondary | ICD-10-CM | POA: Diagnosis not present

## 2018-05-10 DIAGNOSIS — R079 Chest pain, unspecified: Secondary | ICD-10-CM | POA: Diagnosis not present

## 2018-06-15 DIAGNOSIS — G4459 Other complicated headache syndrome: Secondary | ICD-10-CM | POA: Diagnosis not present

## 2018-06-15 DIAGNOSIS — M25519 Pain in unspecified shoulder: Secondary | ICD-10-CM | POA: Diagnosis not present

## 2018-06-15 DIAGNOSIS — Z79899 Other long term (current) drug therapy: Secondary | ICD-10-CM | POA: Diagnosis not present

## 2018-07-24 DIAGNOSIS — T59814A Toxic effect of smoke, undetermined, initial encounter: Secondary | ICD-10-CM | POA: Diagnosis not present

## 2018-07-27 DIAGNOSIS — Z23 Encounter for immunization: Secondary | ICD-10-CM | POA: Diagnosis not present

## 2018-07-27 DIAGNOSIS — M549 Dorsalgia, unspecified: Secondary | ICD-10-CM | POA: Diagnosis not present

## 2018-07-27 DIAGNOSIS — J449 Chronic obstructive pulmonary disease, unspecified: Secondary | ICD-10-CM | POA: Diagnosis not present

## 2018-09-20 DIAGNOSIS — Z79891 Long term (current) use of opiate analgesic: Secondary | ICD-10-CM | POA: Diagnosis not present

## 2018-09-21 DIAGNOSIS — G4459 Other complicated headache syndrome: Secondary | ICD-10-CM | POA: Diagnosis not present

## 2018-09-21 DIAGNOSIS — M25519 Pain in unspecified shoulder: Secondary | ICD-10-CM | POA: Diagnosis not present

## 2018-10-25 DIAGNOSIS — J449 Chronic obstructive pulmonary disease, unspecified: Secondary | ICD-10-CM | POA: Diagnosis not present

## 2018-10-25 DIAGNOSIS — K219 Gastro-esophageal reflux disease without esophagitis: Secondary | ICD-10-CM | POA: Diagnosis not present

## 2018-10-25 DIAGNOSIS — E785 Hyperlipidemia, unspecified: Secondary | ICD-10-CM | POA: Diagnosis not present

## 2018-11-16 DIAGNOSIS — M25519 Pain in unspecified shoulder: Secondary | ICD-10-CM | POA: Diagnosis not present

## 2018-11-16 DIAGNOSIS — G4459 Other complicated headache syndrome: Secondary | ICD-10-CM | POA: Diagnosis not present

## 2018-12-22 ENCOUNTER — Emergency Department (HOSPITAL_COMMUNITY)
Admission: EM | Admit: 2018-12-22 | Discharge: 2018-12-22 | Disposition: A | Discharge status: Home / Self Care | Payer: Medicare Other | Attending: Emergency Medicine | Admitting: Emergency Medicine

## 2018-12-22 ENCOUNTER — Encounter (HOSPITAL_COMMUNITY): Payer: Self-pay | Admitting: Emergency Medicine

## 2018-12-22 ENCOUNTER — Other Ambulatory Visit: Payer: Self-pay

## 2018-12-22 DIAGNOSIS — F17210 Nicotine dependence, cigarettes, uncomplicated: Secondary | ICD-10-CM | POA: Insufficient documentation

## 2018-12-22 DIAGNOSIS — L0201 Cutaneous abscess of face: Secondary | ICD-10-CM | POA: Diagnosis not present

## 2018-12-22 DIAGNOSIS — Z79899 Other long term (current) drug therapy: Secondary | ICD-10-CM | POA: Diagnosis not present

## 2018-12-22 DIAGNOSIS — R220 Localized swelling, mass and lump, head: Secondary | ICD-10-CM | POA: Diagnosis present

## 2018-12-22 MED ORDER — POVIDONE-IODINE 10 % EX SOLN
CUTANEOUS | Status: DC | PRN
  Administered 2018-12-22: 12:00:00 via TOPICAL
  Filled 2018-12-22: qty 15

## 2018-12-22 MED ORDER — LIDOCAINE HCL (PF) 2 % IJ SOLN
INTRAMUSCULAR | Status: AC
  Administered 2018-12-22: 5 mL via INTRADERMAL
  Filled 2018-12-22: qty 20

## 2018-12-22 MED ORDER — LIDOCAINE HCL (PF) 2 % IJ SOLN
5.0000 mL | Freq: Once | INTRAMUSCULAR | Status: AC
  Administered 2018-12-22: 5 mL via INTRADERMAL

## 2018-12-22 MED ORDER — HYDROCODONE-ACETAMINOPHEN 5-325 MG PO TABS: ORAL_TABLET | ORAL | 0 refills | Status: DC

## 2018-12-22 MED ORDER — DOXYCYCLINE HYCLATE 100 MG PO TABS
100.0000 mg | ORAL_TABLET | Freq: Once | ORAL | Status: AC
  Administered 2018-12-22: 100 mg via ORAL
  Filled 2018-12-22: qty 1

## 2018-12-22 MED ORDER — HYDROCODONE-ACETAMINOPHEN 5-325 MG PO TABS
1.0000 | ORAL_TABLET | Freq: Once | ORAL | Status: AC
Start: 2018-12-22 — End: 2018-12-22
  Administered 2018-12-22: 1 via ORAL
  Filled 2018-12-22: qty 1

## 2018-12-22 MED ORDER — DOXYCYCLINE HYCLATE 100 MG PO CAPS: 100.0000 mg | ORAL_CAPSULE | Freq: Two times a day (BID) | ORAL | 0 refills | Status: DC

## 2018-12-22 NOTE — Discharge Instructions (Addendum)
Apply warm wet compresses on and off to your face.  The packing will need to be removed in 2 days.  Continue the compresses until the area heals.  The antibiotic as directed until its finished, return to the ER for any worsening symptoms.

## 2018-12-22 NOTE — ED Provider Notes (Signed)
[facility] EMERGENCY DEPARTMENT Provider Note   CSN: [id number] Arrival date & time: 12/22/18  1006     History   Chief Complaint Chief Complaint  Patient presents with  . Abscess    HPI Jeffrey Wilcox is a 53 y.o. male.  HPI   Jeffrey Wilcox is a 53 y.o. male who presents to the Emergency Department complaining of pain, and swelling to his left cheek.  He noticed a small what appeared to be a pimple to his cheek a few days ago.  Yesterday, he noticed swelling to the side of his face and this morning he states the area began draining yellow pus.  He reports history of frequent abscesses and had a similar appearing 1 several years ago to the right side of his face.  He denies difficulty swallowing, fever, chills, neck pain and dental pain.  Past Medical History:  Diagnosis Date  . Anxiety    takes Xanax daily as needed  . Arthritis, rheumatoid (HCC)   . Chronic back pain    DDD  . Degenerative disc disease   . Degenerative disc disease   . Dizziness    r/t seizures per pt  . Enlarged prostate    takes Flomax daily  . History of bronchitis many yrs ago  . History of colon polyps    benign  . History of migraine    last one a month ago-takes Maxalt if needed  . Hyperlipidemia    takes Simvastatin nightly  . Joint pain   . Joint swelling   . Muscle spasm    takes Flexeril daily as needed  . Osteoarthritis   . Pneumonia at age 32   "walking"  . PTSD (post-traumatic stress disorder)    takes Lexapro daily  . Seizures (HCC)    takes Dilantin and Lamictal daily;last seizure was 2 months ago  . Severe depression (HCC)    takes Brintellix daily  . Shortness of breath dyspnea    with exertion;has Spiriva and Albuterol if needed  . Slow urinary stream     Patient Active Problem List   Diagnosis Date Noted  . Epilepsy (HCC) 07/26/2015    Past Surgical History:  Procedure Laterality Date  . COLONOSCOPY    . INGUINAL HERNIA REPAIR  01/06/2012   Procedure:  HERNIA REPAIR INGUINAL ADULT;  Surgeon: Dalia Heading, MD;  Location: [facility];  Service: General;  Laterality: Right;  . MULTIPLE TOOTH EXTRACTIONS    . TONSILLECTOMY    . VAGUS NERVE STIMULATOR INSERTION N/A 07/26/2015   Procedure: VAGAL NERVE STIMULATOR PLACEMENT;  Surgeon: Lisbeth Renshaw, MD;  Location: [facility];  Service: Neurosurgery;  Laterality: N/A;  vagal nerve stimulator placement  . VAGUS NERVE STIMULATOR INSERTION Left 2018      Home Medications    Prior to Admission medications   Medication Sig Start Date End Date Taking? Authorizing Provider  albuterol (PROVENTIL HFA;VENTOLIN HFA) 108 (90 BASE) MCG/ACT inhaler Inhale 1-2 puffs into the lungs every 6 (six) hours as needed for wheezing or shortness of breath.    [provider]  ALPRAZolam Prudy Feeler) 1 MG tablet Take 1 mg by mouth 4 (four) times daily as needed. For anxiety/sleep 09/22/13   [provider]  BRINTELLIX 20 MG TABS Take 20 mg by mouth at bedtime.  09/25/13   [provider]  cyclobenzaprine (FLEXERIL) 10 MG tablet Take 10 mg by mouth 3 (three) times daily as needed. Muscle Spasms.    [provider]  diphenhydrAMINE (BENADRYL) 25 mg capsule Take 25 mg by mouth daily as needed for allergies.    [provider]  HYDROcodone-acetaminophen (NORCO) 7.5-325 MG tablet One every six hours for pain as needed.  Do not drive car or operate machinery while taking this medicine.  Must last 14 days. 04/19/17   Darreld McleanKeeling, Wayne, MD  LamoTRIgine (LAMICTAL XR) 200 MG TB24 Take 1 tablet by mouth 2 (two) times daily.     [provider]  phenytoin (DILANTIN) 300 MG ER capsule Take 300 mg by mouth 2 (two) times daily.     [provider]  pregabalin (LYRICA) 75 MG capsule Take 75 mg by mouth 2 (two) times daily.    [provider]  rizatriptan (MAXALT-MLT) 10 MG disintegrating tablet Take 10 mg by mouth as needed for migraine. May repeat in 2 hours if needed     [provider]  simvastatin (ZOCOR) 20 MG tablet Take 20 mg by mouth at bedtime.     [provider]  Tamsulosin HCl (FLOMAX) 0.4 MG CAPS Take 0.4 mg by mouth at bedtime.     [provider]  tiotropium (SPIRIVA) 18 MCG inhalation capsule Place 18 mcg into inhaler and inhale daily.    [provider]    Family History Family History  Adopted: Yes    Social History Social History   Tobacco Use  . Smoking status: Current Every Day Smoker    Packs/day: 0.50    Years: 33.00    Pack years: 16.50    Types: Cigarettes  . Smokeless tobacco: Former Engineer, waterUser  Substance Use Topics  . Alcohol use: No    Comment: recovering alcoholic for 12 years  . Drug use: Yes    Types: Marijuana    Comment: a week ago     Allergies   Other and Penicillins   Review of Systems Review of Systems  Constitutional: Negative for chills and fever.  Gastrointestinal: Negative for nausea and vomiting.  Musculoskeletal: Negative for arthralgias and joint swelling.  Skin: Negative for color change.       Abscess left face   Hematological: Negative for adenopathy.     Physical Exam Updated Vital Signs BP 118/84 (BP Location: Right Arm)   Pulse 98   Temp 97.9 F (36.6 C) (Temporal)   Resp 20   Ht 5' 10.5" (1.791 m)   Wt 89.4 kg   SpO2 100%   BMI 27.87 kg/m   Physical Exam Vitals signs and nursing note reviewed.  Constitutional:      General: He is not in acute distress.    Appearance: Normal appearance. He is well-developed. He is not ill-appearing.  HENT:     Head:     Comments: Localized area of fluctuance to the left cheek.  moderate purulent drainage noted.  No erythema.      Mouth/Throat:     Mouth: Mucous membranes are moist.     Pharynx: Oropharynx is clear.  Neck:     Musculoskeletal: Normal range of motion. No neck rigidity.  Cardiovascular:     Rate and Rhythm: Normal rate and regular rhythm.     Heart sounds: Normal heart sounds. No  murmur.  Pulmonary:     Effort: Pulmonary effort is normal. No respiratory distress.     Breath sounds: Normal breath sounds.  Lymphadenopathy:     Cervical: No cervical adenopathy.  Skin:    General: Skin is warm and dry.     Capillary  Refill: Capillary refill takes less than 2 seconds.     Findings: No erythema.  Neurological:     General: No focal deficit present.     Mental Status: He is alert. Mental status is at baseline.     Motor: No abnormal muscle tone.     Coordination: Coordination normal.      ED Treatments / Results  Labs (all labs ordered are listed, but only abnormal results are displayed) Labs Reviewed - No data to display  EKG None  Radiology No results found.  Procedures Procedures (including critical care time)  INCISION AND DRAINAGE Performed by: Natayla Cadenhead Consent: Verbal consent obtained. Risks and benefits: risks, benefits and alternatives were discussed Type: abscess  Body area: left face Anesthesia: local infiltration  Incision was made with a #11 scalpel.  Local anesthetic: lidocaine 2% w/o epinephrine  Anesthetic total: 3 ml  Complexity: complex Blunt dissection to break up loculations  Drainage: purulent  Drainage amount: moderate  Packing material: 1/4 in iodoform gauze  Patient tolerance: Patient tolerated the procedure well with no immediate complications.     Medications Ordered in ED Medications  povidone-iodine (BETADINE) 10 % external solution (has no administration in time range)  lidocaine (XYLOCAINE) 2 % injection 5 mL (has no administration in time range)  doxycycline (VIBRA-TABS) tablet 100 mg (has no administration in time range)  HYDROcodone-acetaminophen (NORCO/VICODIN) 5-325 MG per tablet 1 tablet (has no administration in time range)     Initial Impression / Assessment and Plan / ED Course  I have reviewed the triage vital signs and the nursing notes.  Pertinent labs & imaging results that were  available during my care of the patient were reviewed by me and considered in my medical decision making (see chart for details).     Patient with history of recurrent abscesses.  He is well-appearing and nontoxic.  Successful I&D of abscess of the left cheek.  He agrees to warm compresses and packing removal in 2 days.  Will start antibiotics.  Return precautions were discussed.  Final Clinical Impressions(s) / ED Diagnoses   Final diagnoses:  Abscess of face    ED Discharge Orders    None       Pauline Aus, PA-C 12/22/18 1334    Blane Ohara, MD 12/22/18 [phone]

## 2018-12-22 NOTE — ED Triage Notes (Signed)
Pt c/o abscess to left side of face since yesterday am, pt reports some drainage starting this am

## 2018-12-30 DIAGNOSIS — R569 Unspecified convulsions: Secondary | ICD-10-CM | POA: Diagnosis not present

## 2018-12-30 DIAGNOSIS — J449 Chronic obstructive pulmonary disease, unspecified: Secondary | ICD-10-CM | POA: Diagnosis not present

## 2018-12-30 DIAGNOSIS — Z0000 Encounter for general adult medical examination without abnormal findings: Secondary | ICD-10-CM | POA: Diagnosis not present

## 2018-12-30 DIAGNOSIS — Z79899 Other long term (current) drug therapy: Secondary | ICD-10-CM | POA: Diagnosis not present

## 2018-12-30 DIAGNOSIS — Z0001 Encounter for general adult medical examination with abnormal findings: Secondary | ICD-10-CM | POA: Diagnosis not present

## 2018-12-30 DIAGNOSIS — E785 Hyperlipidemia, unspecified: Secondary | ICD-10-CM | POA: Diagnosis not present

## 2018-12-30 DIAGNOSIS — Z1389 Encounter for screening for other disorder: Secondary | ICD-10-CM | POA: Diagnosis not present

## 2019-01-11 DIAGNOSIS — M25519 Pain in unspecified shoulder: Secondary | ICD-10-CM | POA: Diagnosis not present

## 2019-01-11 DIAGNOSIS — G4459 Other complicated headache syndrome: Secondary | ICD-10-CM | POA: Diagnosis not present

## 2019-01-19 ENCOUNTER — Encounter: Payer: Self-pay | Admitting: Internal Medicine

## 2019-03-17 IMAGING — DX DG CHEST 2V
2 series · 2 of 2 positions shown · non-contrast
Comparison: Chest radiograph July 26, 2015

CLINICAL DATA: Chest pain and hemoptysis

EXAM:
CHEST - 2 VIEW

[chest pa]
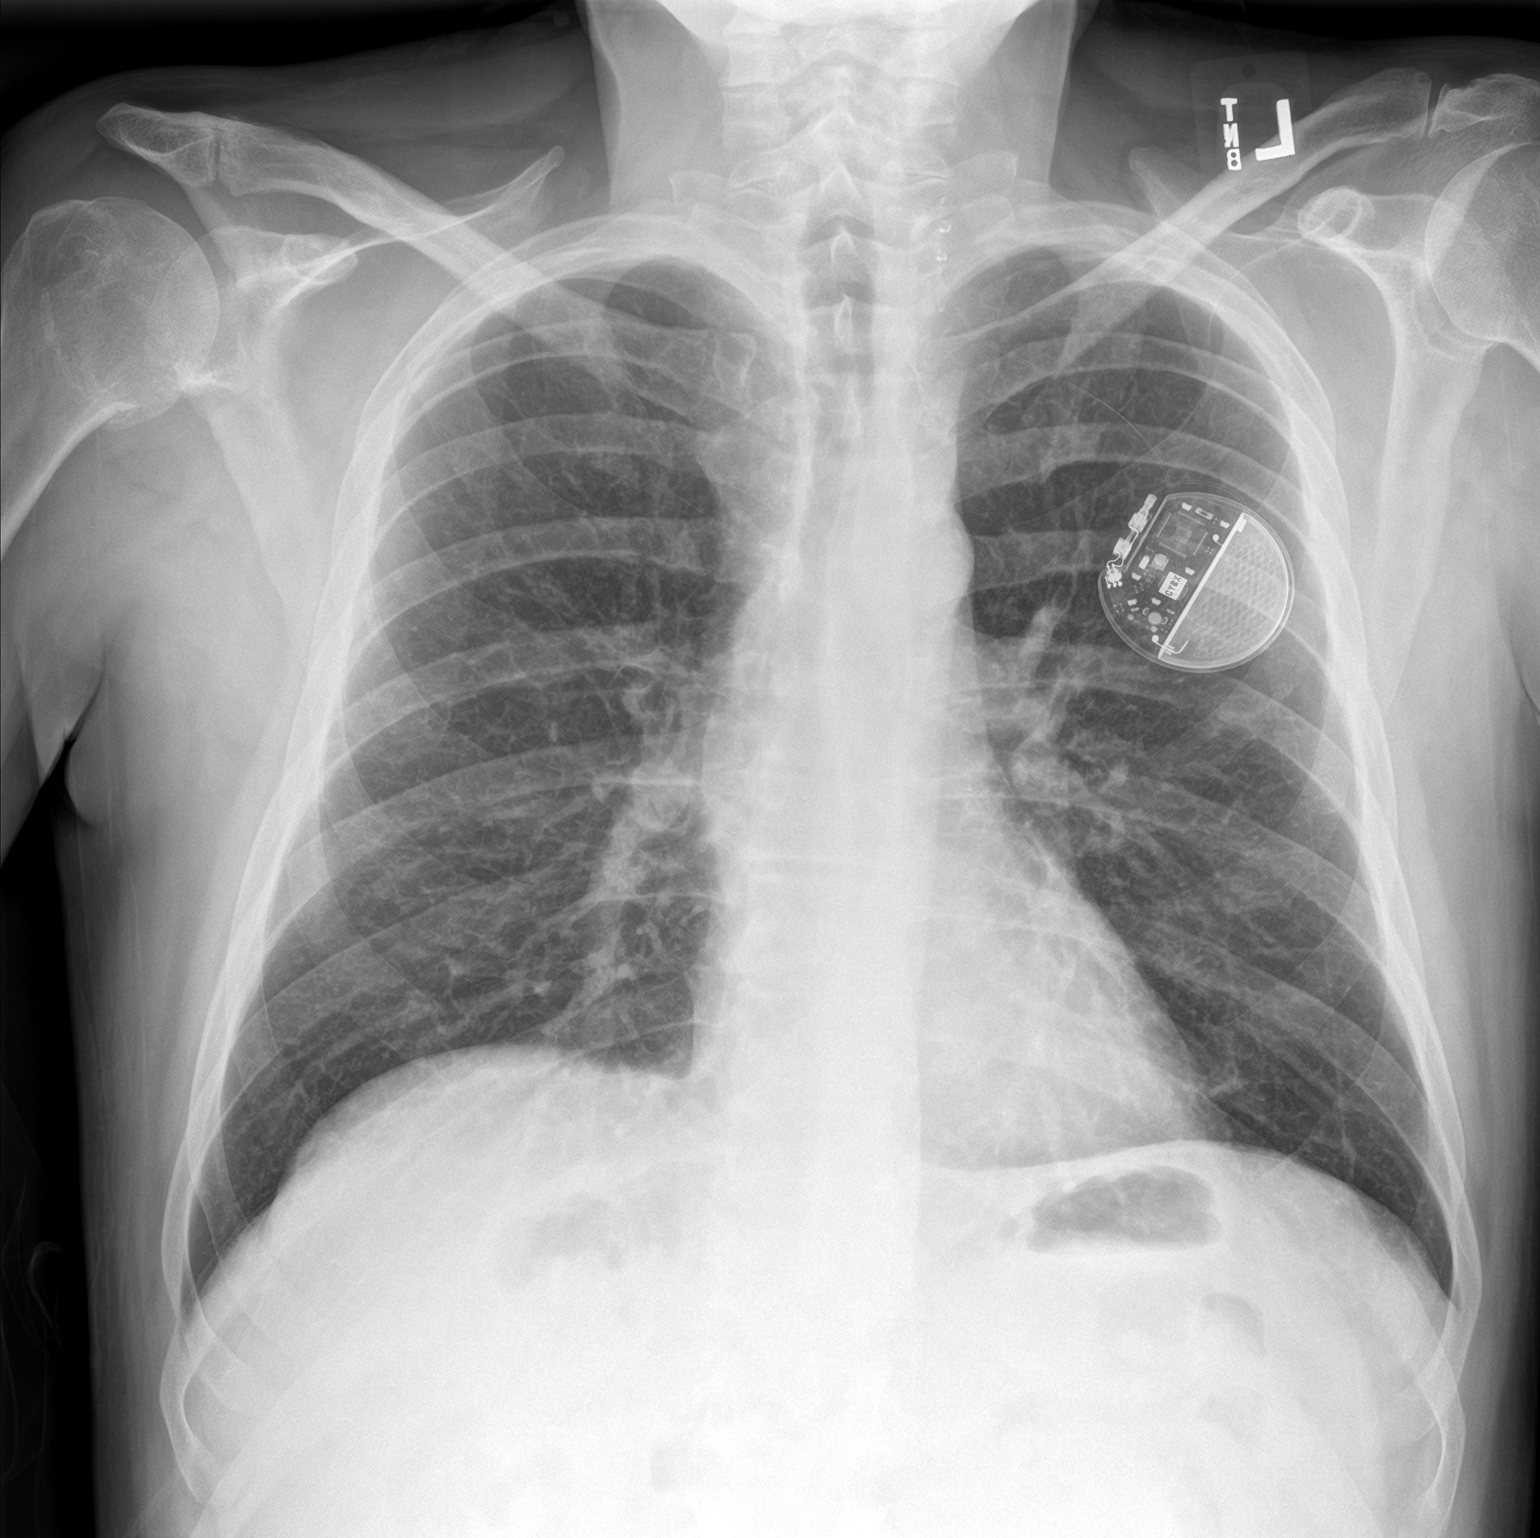

[chest lat]
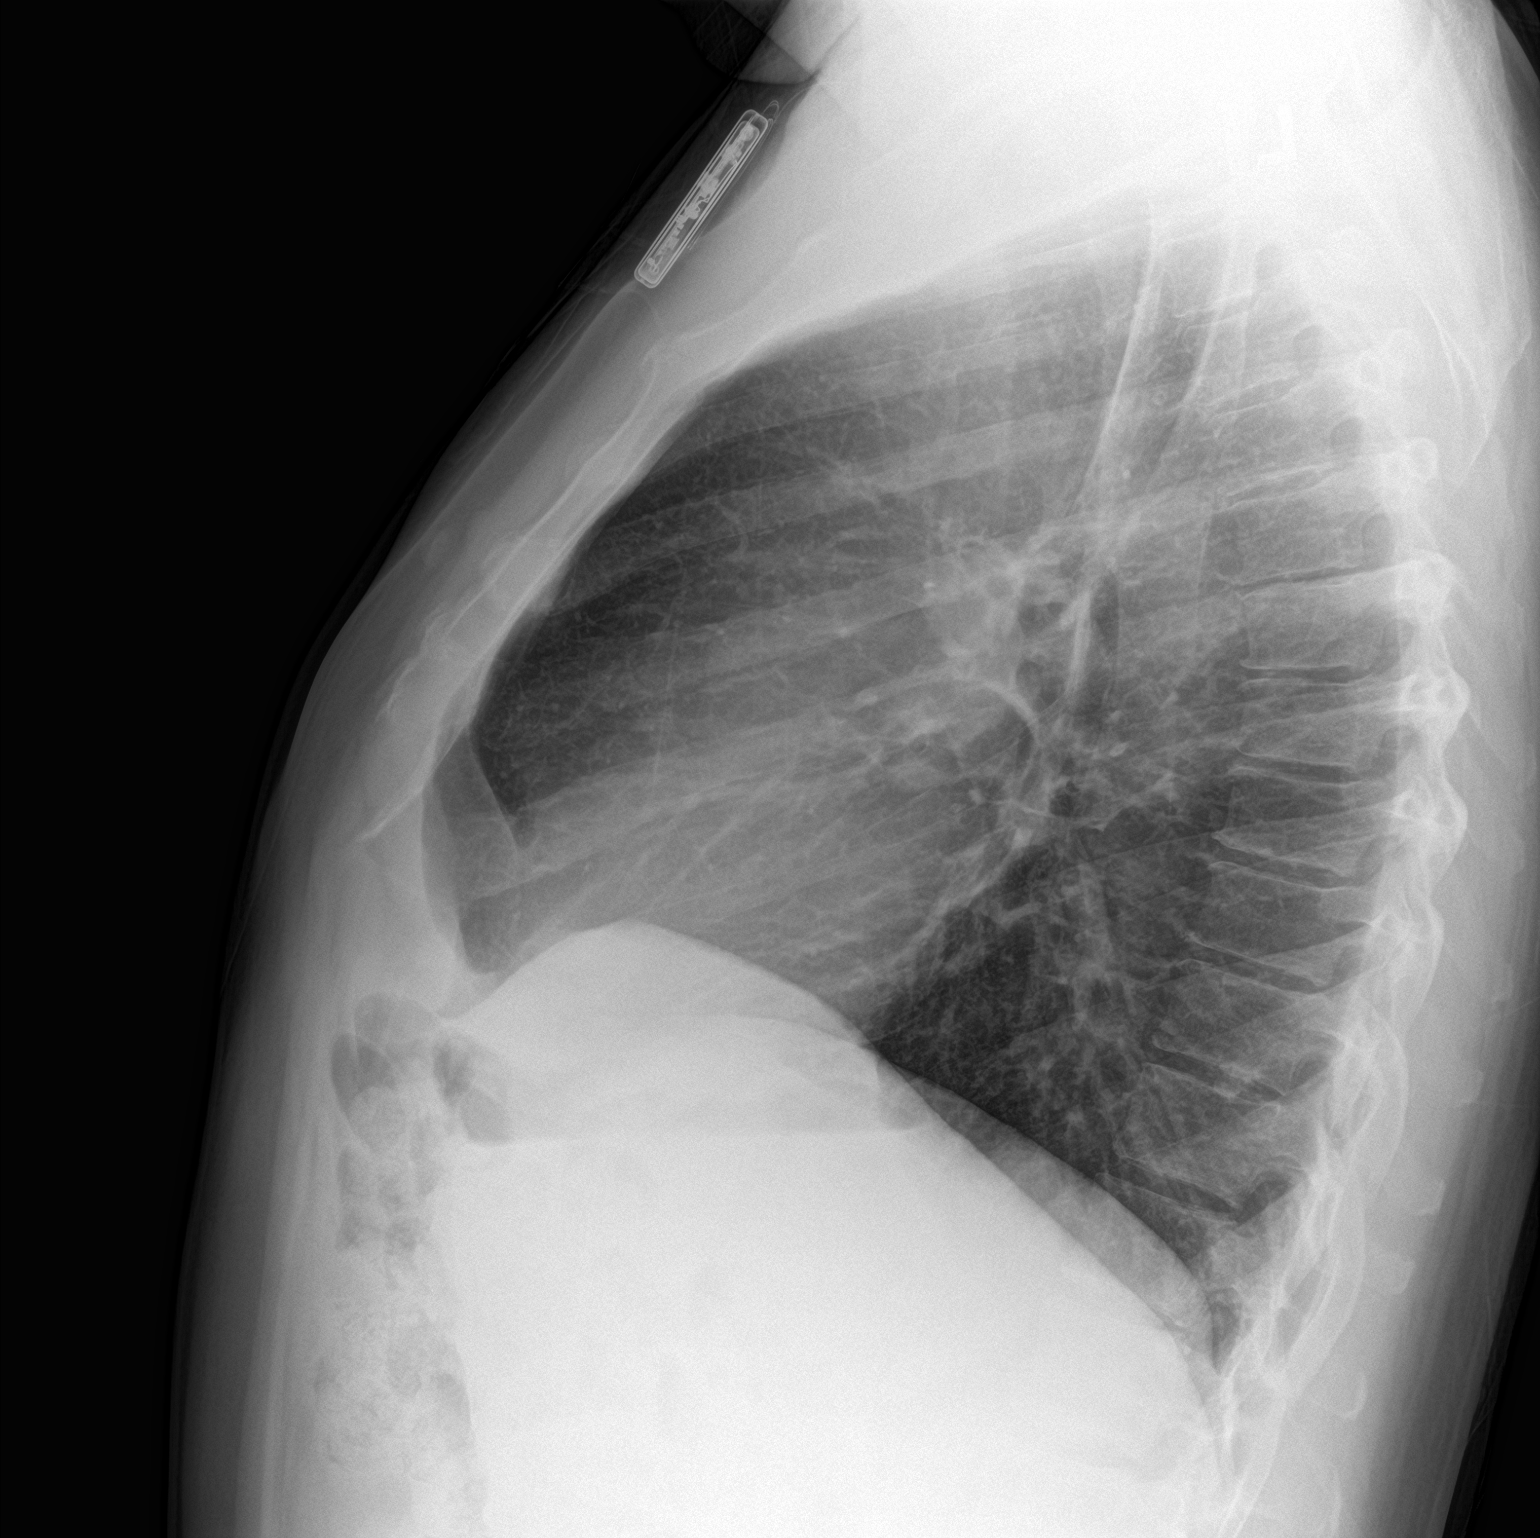

[2 of 2 positions shown; findings below may reference images not displayed]

FINDINGS: Lungs are clear. Heart size and pulmonary vascularity are normal. No
adenopathy. Stimulator noted on the left anteriorly with lead tip at
the level of T1 on the left. No pneumothorax.
IMPRESSION: No edema or consolidation.

## 2019-03-30 ENCOUNTER — Ambulatory Visit: Payer: Medicare Other | Admitting: Gastroenterology

## 2019-03-30 ENCOUNTER — Telehealth: Payer: Self-pay | Admitting: *Deleted

## 2019-03-30 ENCOUNTER — Other Ambulatory Visit: Payer: Self-pay

## 2019-03-30 NOTE — Telephone Encounter (Signed)
Called patient for virtual meeting with AB and had to [facility]. Patient did not return call

## 2019-04-05 ENCOUNTER — Other Ambulatory Visit: Payer: Self-pay

## 2019-04-05 ENCOUNTER — Encounter: Payer: Self-pay | Admitting: Gastroenterology

## 2019-04-05 ENCOUNTER — Ambulatory Visit: Payer: Medicare Other | Admitting: Gastroenterology

## 2019-04-05 ENCOUNTER — Encounter: Payer: Self-pay | Admitting: Internal Medicine

## 2019-04-05 ENCOUNTER — Telehealth: Payer: Self-pay | Admitting: Gastroenterology

## 2019-04-05 DIAGNOSIS — Z86010 Personal history of colonic polyps: Secondary | ICD-10-CM | POA: Insufficient documentation

## 2019-04-05 NOTE — Progress Notes (Signed)
  Virtual call was attempted. However, due to technical difficulties, visit was unable to be completed.   Will arrange new patient appointment in office for surveillance colonoscopy. Needs Propofol.

## 2019-04-05 NOTE — Telephone Encounter (Signed)
PATIENT RESCHEDULED TO 04/05/19

## 2019-04-05 NOTE — Telephone Encounter (Signed)
Virtual visit today had to be cancelled. Due to technical difficulties, unable to complete via doxy.  Please arrange new patient visit in person for surveillance colonoscopy, needs Propofol.

## 2019-04-06 NOTE — Telephone Encounter (Signed)
PATIENT RESCHEDULED

## 2019-04-13 DIAGNOSIS — M25519 Pain in unspecified shoulder: Secondary | ICD-10-CM | POA: Diagnosis not present

## 2019-04-13 DIAGNOSIS — G4459 Other complicated headache syndrome: Secondary | ICD-10-CM | POA: Diagnosis not present

## 2019-06-12 NOTE — Progress Notes (Deleted)
Referring Provider: Dr. Legrand Rams Primary Care Physician:  Rosita Fire, MD Primary Gastroenterologist:  Dr. Gala Romney  No chief complaint on file.   HPI:   Jeffrey Wilcox is a 53 y.o. male presenting today at the request of Dr. Legrand Rams for consult TCS. Past medical history significant for personal colon polyps, seizure disorder s/p vagal nerve stimulator, HLD, anxiety.   Today:   Past Medical History:  Diagnosis Date  . Anxiety    takes Xanax daily as needed  . Arthritis, rheumatoid ([place])   . Chronic back pain    DDD  . Degenerative disc disease   . Degenerative disc disease   . Dizziness    r/t seizures per pt  . Enlarged prostate    takes Flomax daily  . History of bronchitis many yrs ago  . History of colon polyps    benign  . History of migraine    last one a month ago-takes Maxalt if needed  . Hyperlipidemia    takes Simvastatin nightly  . Joint pain   . Joint swelling   . Muscle spasm    takes Flexeril daily as needed  . Osteoarthritis   . Pneumonia at age 78   "walking"  . PTSD (post-traumatic stress disorder)    takes Lexapro daily  . Seizures ([place])    takes Dilantin and Lamictal daily;last seizure was 2 months ago  . Severe depression ([place])    takes Brintellix daily  . Shortness of breath dyspnea    with exertion;has Spiriva and Albuterol if needed  . Slow urinary stream     Past Surgical History:  Procedure Laterality Date  . COLONOSCOPY    . INGUINAL HERNIA REPAIR  01/06/2012   Procedure: HERNIA REPAIR INGUINAL ADULT;  Surgeon: Jamesetta So, MD;  Location: [facility];  Service: General;  Laterality: Right;  . MULTIPLE TOOTH EXTRACTIONS    . TONSILLECTOMY    . VAGUS NERVE STIMULATOR INSERTION N/A 07/26/2015   Procedure: VAGAL NERVE STIMULATOR PLACEMENT;  Surgeon: Consuella Lose, MD;  Location: [place] NEURO ORS;  Service: Neurosurgery;  Laterality: N/A;  vagal nerve stimulator placement  . VAGUS NERVE STIMULATOR INSERTION Left 2018    Current Outpatient  Medications  Medication Sig Dispense Refill  . albuterol (PROVENTIL HFA;VENTOLIN HFA) 108 (90 BASE) MCG/ACT inhaler Inhale 1-2 puffs into the lungs every 6 (six) hours as needed for wheezing or shortness of breath.    . ALPRAZolam (XANAX) 1 MG tablet Take 1 mg by mouth 4 (four) times daily as needed. For anxiety/sleep    . BRINTELLIX 20 MG TABS Take 20 mg by mouth at bedtime.     . cyclobenzaprine (FLEXERIL) 10 MG tablet Take 10 mg by mouth 2 (two) times a day. Muscle Spasms.    . diphenhydrAMINE (BENADRYL) 25 mg capsule Take 25 mg by mouth daily as needed for allergies.    Marland Kitchen doxycycline (VIBRAMYCIN) 100 MG capsule Take 1 capsule (100 mg total) by mouth 2 (two) times daily. (Patient not taking: Reported on 04/05/2019) 20 capsule 0  . escitalopram (LEXAPRO) 20 MG tablet Take 1 tablet by mouth 2 (two) times a day.    Marland Kitchen HYDROcodone-acetaminophen (NORCO/VICODIN) 5-325 MG tablet Take one tab po q 4 hrs prn pain (Patient not taking: Reported on 04/05/2019) 10 tablet 0  . LamoTRIgine (LAMICTAL XR) 200 MG TB24 Take 1 tablet by mouth 2 (two) times daily.     . LamoTRIgine 300 MG TB24 24 hour tablet Take 1 tablet by mouth  2 (two) times a day.    Marland Kitchen omeprazole (PRILOSEC) 20 MG capsule Take 1 capsule by mouth daily.    . phenytoin (DILANTIN) 300 MG ER capsule Take 300 mg by mouth 2 (two) times daily.     . pregabalin (LYRICA) 75 MG capsule Take 75 mg by mouth 2 (two) times daily.    . rizatriptan (MAXALT-MLT) 10 MG disintegrating tablet Take 10 mg by mouth as needed for migraine. May repeat in 2 hours if needed    . simvastatin (ZOCOR) 20 MG tablet Take 20 mg by mouth at bedtime.     . Tamsulosin HCl (FLOMAX) 0.4 MG CAPS Take 0.4 mg by mouth at bedtime.     Marland Kitchen tiotropium (SPIRIVA) 18 MCG inhalation capsule Place 18 mcg into inhaler and inhale 2 (two) times a day.      No current facility-administered medications for this visit.     Allergies as of 06/13/2019 - Review Complete 04/05/2019  Allergen Reaction  Noted  . Other Anaphylaxis and Rash 06/23/2011  . Penicillins Other (See Comments) 06/23/2011    Family History  Adopted: Yes    Social History   Socioeconomic History  . Marital status: Divorced    Spouse name: Not on file  . Number of children: Not on file  . Years of education: Not on file  . Highest education level: Not on file  Occupational History  . Not on file  Social Needs  . Financial resource strain: Not on file  . Food insecurity    Worry: Not on file    Inability: Not on file  . Transportation needs    Medical: Not on file    Non-medical: Not on file  Tobacco Use  . Smoking status: Current Every Day Smoker    Packs/day: 0.50    Years: 33.00    Pack years: 16.50    Types: Cigarettes  . Smokeless tobacco: Former Engineer, water and Sexual Activity  . Alcohol use: No    Comment: recovering alcoholic for 12 years  . Drug use: Yes    Types: Marijuana    Comment: last time was Easter Sunday  . Sexual activity: Not Currently  Lifestyle  . Physical activity    Days per week: Not on file    Minutes per session: Not on file  . Stress: Not on file  Relationships  . Social Musician on phone: Not on file    Gets together: Not on file    Attends religious service: Not on file    Active member of club or organization: Not on file    Attends meetings of clubs or organizations: Not on file    Relationship status: Not on file  . Intimate partner violence    Fear of current or ex partner: Not on file    Emotionally abused: Not on file    Physically abused: Not on file    Forced sexual activity: Not on file  Other Topics Concern  . Not on file  Social History Narrative  . Not on file    Review of Systems: Gen: Denies any fever, chills, fatigue, weight loss, lack of appetite.  CV: Denies chest pain, heart palpitations, peripheral edema, syncope.  Resp: Denies shortness of breath at rest or with exertion. Denies wheezing or cough.  GI: Denies  dysphagia or odynophagia. Denies jaundice, hematemesis, fecal incontinence. GU : Denies urinary burning, urinary frequency, urinary hesitancy MS: Denies joint pain, muscle weakness, cramps, or  limitation of movement.  Derm: Denies rash, itching, dry skin Psych: Denies depression, anxiety, memory loss, and confusion Heme: Denies bruising, bleeding, and enlarged lymph nodes.  Physical Exam: There were no vitals taken for this visit. General:   Alert and oriented. Pleasant and cooperative. Well-nourished and well-developed.  Head:  Normocephalic and atraumatic. Eyes:  Without icterus, sclera clear and conjunctiva pink.  Ears:  Normal auditory acuity. Nose:  No deformity, discharge,  or lesions. Mouth:  No deformity or lesions, oral mucosa pink.  Neck:  Supple, without mass or thyromegaly. Lungs:  Clear to auscultation bilaterally. No wheezes, rales, or rhonchi. No distress.  Heart:  S1, S2 present without murmurs appreciated.  Abdomen:  +BS, soft, non-tender and non-distended. No HSM noted. No guarding or rebound. No masses appreciated.  Rectal:  Deferred  Msk:  Symmetrical without gross deformities. Normal posture. Pulses:  Normal pulses noted. Extremities:  Without clubbing or edema. Neurologic:  Alert and  oriented x4;  grossly normal neurologically. Skin:  Intact without significant lesions or rashes. Cervical Nodes:  No significant cervical adenopathy. Psych:  Alert and cooperative. Normal mood and affect.

## 2019-06-13 ENCOUNTER — Ambulatory Visit: Payer: Medicare Other | Admitting: Gastroenterology

## 2019-06-13 ENCOUNTER — Encounter: Payer: Self-pay | Admitting: Gastroenterology

## 2019-07-06 DIAGNOSIS — G4459 Other complicated headache syndrome: Secondary | ICD-10-CM | POA: Diagnosis not present

## 2019-07-06 DIAGNOSIS — M25519 Pain in unspecified shoulder: Secondary | ICD-10-CM | POA: Diagnosis not present

## 2019-08-15 ENCOUNTER — Other Ambulatory Visit: Payer: Self-pay

## 2019-08-15 DIAGNOSIS — Z20822 Contact with and (suspected) exposure to covid-19: Secondary | ICD-10-CM

## 2019-08-17 LAB — NOVEL CORONAVIRUS, NAA: SARS-CoV-2, NAA: NOT DETECTED

## 2019-08-29 DIAGNOSIS — R569 Unspecified convulsions: Secondary | ICD-10-CM | POA: Diagnosis not present

## 2019-08-29 DIAGNOSIS — K219 Gastro-esophageal reflux disease without esophagitis: Secondary | ICD-10-CM | POA: Diagnosis not present

## 2019-08-29 DIAGNOSIS — J449 Chronic obstructive pulmonary disease, unspecified: Secondary | ICD-10-CM | POA: Diagnosis not present

## 2019-08-29 DIAGNOSIS — Z23 Encounter for immunization: Secondary | ICD-10-CM | POA: Diagnosis not present

## 2019-09-15 DIAGNOSIS — J449 Chronic obstructive pulmonary disease, unspecified: Secondary | ICD-10-CM | POA: Diagnosis not present

## 2019-09-15 DIAGNOSIS — G250 Essential tremor: Secondary | ICD-10-CM | POA: Diagnosis not present

## 2019-09-15 DIAGNOSIS — R569 Unspecified convulsions: Secondary | ICD-10-CM | POA: Diagnosis not present

## 2019-09-26 DIAGNOSIS — R251 Tremor, unspecified: Secondary | ICD-10-CM | POA: Diagnosis not present

## 2019-09-26 DIAGNOSIS — M25519 Pain in unspecified shoulder: Secondary | ICD-10-CM | POA: Diagnosis not present

## 2019-09-26 DIAGNOSIS — G4459 Other complicated headache syndrome: Secondary | ICD-10-CM | POA: Diagnosis not present

## 2019-10-04 DIAGNOSIS — R7303 Prediabetes: Secondary | ICD-10-CM | POA: Diagnosis not present

## 2019-10-04 DIAGNOSIS — D519 Vitamin B12 deficiency anemia, unspecified: Secondary | ICD-10-CM | POA: Diagnosis not present

## 2019-10-04 DIAGNOSIS — Z79899 Other long term (current) drug therapy: Secondary | ICD-10-CM | POA: Diagnosis not present

## 2019-10-04 DIAGNOSIS — R5383 Other fatigue: Secondary | ICD-10-CM | POA: Diagnosis not present

## 2019-10-04 DIAGNOSIS — E559 Vitamin D deficiency, unspecified: Secondary | ICD-10-CM | POA: Diagnosis not present

## 2019-10-15 DIAGNOSIS — J449 Chronic obstructive pulmonary disease, unspecified: Secondary | ICD-10-CM | POA: Diagnosis not present

## 2019-10-15 DIAGNOSIS — K219 Gastro-esophageal reflux disease without esophagitis: Secondary | ICD-10-CM | POA: Diagnosis not present

## 2019-10-24 DIAGNOSIS — M25519 Pain in unspecified shoulder: Secondary | ICD-10-CM | POA: Diagnosis not present

## 2019-10-24 DIAGNOSIS — G4459 Other complicated headache syndrome: Secondary | ICD-10-CM | POA: Diagnosis not present

## 2019-10-24 DIAGNOSIS — R251 Tremor, unspecified: Secondary | ICD-10-CM | POA: Diagnosis not present

## 2019-11-15 DIAGNOSIS — K219 Gastro-esophageal reflux disease without esophagitis: Secondary | ICD-10-CM | POA: Diagnosis not present

## 2019-11-15 DIAGNOSIS — M549 Dorsalgia, unspecified: Secondary | ICD-10-CM | POA: Diagnosis not present

## 2020-01-02 DIAGNOSIS — F17210 Nicotine dependence, cigarettes, uncomplicated: Secondary | ICD-10-CM | POA: Diagnosis not present

## 2020-01-02 DIAGNOSIS — Z1389 Encounter for screening for other disorder: Secondary | ICD-10-CM | POA: Diagnosis not present

## 2020-01-02 DIAGNOSIS — Z0001 Encounter for general adult medical examination with abnormal findings: Secondary | ICD-10-CM | POA: Diagnosis not present

## 2020-01-02 DIAGNOSIS — F17290 Nicotine dependence, other tobacco product, uncomplicated: Secondary | ICD-10-CM | POA: Diagnosis not present

## 2020-01-02 DIAGNOSIS — E785 Hyperlipidemia, unspecified: Secondary | ICD-10-CM | POA: Diagnosis not present

## 2020-01-02 DIAGNOSIS — K219 Gastro-esophageal reflux disease without esophagitis: Secondary | ICD-10-CM | POA: Diagnosis not present

## 2020-01-03 DIAGNOSIS — E785 Hyperlipidemia, unspecified: Secondary | ICD-10-CM | POA: Diagnosis not present

## 2020-01-03 DIAGNOSIS — J449 Chronic obstructive pulmonary disease, unspecified: Secondary | ICD-10-CM | POA: Diagnosis not present

## 2020-01-03 DIAGNOSIS — Z0001 Encounter for general adult medical examination with abnormal findings: Secondary | ICD-10-CM | POA: Diagnosis not present

## 2020-01-25 ENCOUNTER — Ambulatory Visit: Payer: Medicare Other | Attending: Internal Medicine

## 2020-01-25 ENCOUNTER — Other Ambulatory Visit: Payer: Self-pay

## 2020-01-25 DIAGNOSIS — Z20822 Contact with and (suspected) exposure to covid-19: Secondary | ICD-10-CM

## 2020-01-26 LAB — NOVEL CORONAVIRUS, NAA: SARS-CoV-2, NAA: NOT DETECTED

## 2020-01-30 DIAGNOSIS — M549 Dorsalgia, unspecified: Secondary | ICD-10-CM | POA: Diagnosis not present

## 2020-01-30 DIAGNOSIS — K219 Gastro-esophageal reflux disease without esophagitis: Secondary | ICD-10-CM | POA: Diagnosis not present

## 2020-01-31 DIAGNOSIS — R251 Tremor, unspecified: Secondary | ICD-10-CM | POA: Diagnosis not present

## 2020-01-31 DIAGNOSIS — M545 Low back pain: Secondary | ICD-10-CM | POA: Diagnosis not present

## 2020-01-31 DIAGNOSIS — G43701 Chronic migraine without aura, not intractable, with status migrainosus: Secondary | ICD-10-CM | POA: Diagnosis not present

## 2020-03-01 DIAGNOSIS — M549 Dorsalgia, unspecified: Secondary | ICD-10-CM | POA: Diagnosis not present

## 2020-03-01 DIAGNOSIS — K219 Gastro-esophageal reflux disease without esophagitis: Secondary | ICD-10-CM | POA: Diagnosis not present

## 2020-04-23 DIAGNOSIS — R251 Tremor, unspecified: Secondary | ICD-10-CM | POA: Diagnosis not present

## 2020-04-23 DIAGNOSIS — G43701 Chronic migraine without aura, not intractable, with status migrainosus: Secondary | ICD-10-CM | POA: Diagnosis not present

## 2020-04-23 DIAGNOSIS — M545 Low back pain: Secondary | ICD-10-CM | POA: Diagnosis not present

## 2020-05-01 DIAGNOSIS — K219 Gastro-esophageal reflux disease without esophagitis: Secondary | ICD-10-CM | POA: Diagnosis not present

## 2020-05-31 DIAGNOSIS — K219 Gastro-esophageal reflux disease without esophagitis: Secondary | ICD-10-CM | POA: Diagnosis not present

## 2020-06-25 DIAGNOSIS — G43701 Chronic migraine without aura, not intractable, with status migrainosus: Secondary | ICD-10-CM | POA: Diagnosis not present

## 2020-06-25 DIAGNOSIS — E559 Vitamin D deficiency, unspecified: Secondary | ICD-10-CM | POA: Diagnosis not present

## 2020-06-25 DIAGNOSIS — R251 Tremor, unspecified: Secondary | ICD-10-CM | POA: Diagnosis not present

## 2020-06-25 DIAGNOSIS — Z79899 Other long term (current) drug therapy: Secondary | ICD-10-CM | POA: Diagnosis not present

## 2020-07-01 DIAGNOSIS — E785 Hyperlipidemia, unspecified: Secondary | ICD-10-CM | POA: Diagnosis not present

## 2020-07-01 DIAGNOSIS — K219 Gastro-esophageal reflux disease without esophagitis: Secondary | ICD-10-CM | POA: Diagnosis not present

## 2020-07-12 DIAGNOSIS — E559 Vitamin D deficiency, unspecified: Secondary | ICD-10-CM | POA: Diagnosis not present

## 2020-07-12 DIAGNOSIS — Z79899 Other long term (current) drug therapy: Secondary | ICD-10-CM | POA: Diagnosis not present

## 2020-07-12 DIAGNOSIS — J449 Chronic obstructive pulmonary disease, unspecified: Secondary | ICD-10-CM | POA: Diagnosis not present

## 2020-07-12 DIAGNOSIS — R569 Unspecified convulsions: Secondary | ICD-10-CM | POA: Diagnosis not present

## 2020-08-11 DIAGNOSIS — K219 Gastro-esophageal reflux disease without esophagitis: Secondary | ICD-10-CM | POA: Diagnosis not present

## 2020-08-11 DIAGNOSIS — J449 Chronic obstructive pulmonary disease, unspecified: Secondary | ICD-10-CM | POA: Diagnosis not present

## 2020-09-11 DIAGNOSIS — E785 Hyperlipidemia, unspecified: Secondary | ICD-10-CM | POA: Diagnosis not present

## 2020-09-11 DIAGNOSIS — K219 Gastro-esophageal reflux disease without esophagitis: Secondary | ICD-10-CM | POA: Diagnosis not present

## 2020-10-13 DIAGNOSIS — K219 Gastro-esophageal reflux disease without esophagitis: Secondary | ICD-10-CM | POA: Diagnosis not present

## 2020-10-13 DIAGNOSIS — E785 Hyperlipidemia, unspecified: Secondary | ICD-10-CM | POA: Diagnosis not present

## 2020-10-15 DIAGNOSIS — M545 Low back pain, unspecified: Secondary | ICD-10-CM | POA: Diagnosis not present

## 2020-10-15 DIAGNOSIS — R251 Tremor, unspecified: Secondary | ICD-10-CM | POA: Diagnosis not present

## 2020-10-15 DIAGNOSIS — G43701 Chronic migraine without aura, not intractable, with status migrainosus: Secondary | ICD-10-CM | POA: Diagnosis not present

## 2020-11-13 DIAGNOSIS — R569 Unspecified convulsions: Secondary | ICD-10-CM | POA: Diagnosis not present

## 2020-11-13 DIAGNOSIS — J449 Chronic obstructive pulmonary disease, unspecified: Secondary | ICD-10-CM | POA: Diagnosis not present

## 2020-12-14 DIAGNOSIS — F17290 Nicotine dependence, other tobacco product, uncomplicated: Secondary | ICD-10-CM | POA: Diagnosis not present

## 2020-12-14 DIAGNOSIS — J449 Chronic obstructive pulmonary disease, unspecified: Secondary | ICD-10-CM | POA: Diagnosis not present

## 2021-01-31 DIAGNOSIS — Z0001 Encounter for general adult medical examination with abnormal findings: Secondary | ICD-10-CM | POA: Diagnosis not present

## 2021-01-31 DIAGNOSIS — R569 Unspecified convulsions: Secondary | ICD-10-CM | POA: Diagnosis not present

## 2021-01-31 DIAGNOSIS — J449 Chronic obstructive pulmonary disease, unspecified: Secondary | ICD-10-CM | POA: Diagnosis not present

## 2021-01-31 DIAGNOSIS — F17210 Nicotine dependence, cigarettes, uncomplicated: Secondary | ICD-10-CM | POA: Diagnosis not present

## 2021-01-31 DIAGNOSIS — Z1389 Encounter for screening for other disorder: Secondary | ICD-10-CM | POA: Diagnosis not present

## 2021-02-04 DIAGNOSIS — Z79891 Long term (current) use of opiate analgesic: Secondary | ICD-10-CM | POA: Diagnosis not present

## 2021-02-24 DIAGNOSIS — E785 Hyperlipidemia, unspecified: Secondary | ICD-10-CM | POA: Diagnosis not present

## 2021-02-24 DIAGNOSIS — I10 Essential (primary) hypertension: Secondary | ICD-10-CM | POA: Diagnosis not present

## 2021-02-24 DIAGNOSIS — Z79899 Other long term (current) drug therapy: Secondary | ICD-10-CM | POA: Diagnosis not present

## 2021-03-05 DIAGNOSIS — F17210 Nicotine dependence, cigarettes, uncomplicated: Secondary | ICD-10-CM | POA: Diagnosis not present

## 2021-03-05 DIAGNOSIS — J449 Chronic obstructive pulmonary disease, unspecified: Secondary | ICD-10-CM | POA: Diagnosis not present

## 2021-03-21 DIAGNOSIS — M545 Low back pain, unspecified: Secondary | ICD-10-CM | POA: Diagnosis not present

## 2021-04-04 DIAGNOSIS — J449 Chronic obstructive pulmonary disease, unspecified: Secondary | ICD-10-CM | POA: Diagnosis not present

## 2021-05-05 DIAGNOSIS — K219 Gastro-esophageal reflux disease without esophagitis: Secondary | ICD-10-CM | POA: Diagnosis not present

## 2021-05-05 DIAGNOSIS — F17210 Nicotine dependence, cigarettes, uncomplicated: Secondary | ICD-10-CM | POA: Diagnosis not present

## 2021-05-21 DIAGNOSIS — R251 Tremor, unspecified: Secondary | ICD-10-CM | POA: Diagnosis not present

## 2021-05-21 DIAGNOSIS — M545 Low back pain, unspecified: Secondary | ICD-10-CM | POA: Diagnosis not present

## 2021-05-21 DIAGNOSIS — G43701 Chronic migraine without aura, not intractable, with status migrainosus: Secondary | ICD-10-CM | POA: Diagnosis not present

## 2021-06-04 DIAGNOSIS — R569 Unspecified convulsions: Secondary | ICD-10-CM | POA: Diagnosis not present

## 2021-06-04 DIAGNOSIS — J449 Chronic obstructive pulmonary disease, unspecified: Secondary | ICD-10-CM | POA: Diagnosis not present

## 2021-07-05 DIAGNOSIS — J449 Chronic obstructive pulmonary disease, unspecified: Secondary | ICD-10-CM | POA: Diagnosis not present

## 2021-07-05 DIAGNOSIS — F17210 Nicotine dependence, cigarettes, uncomplicated: Secondary | ICD-10-CM | POA: Diagnosis not present

## 2021-07-09 DIAGNOSIS — Z4542 Encounter for adjustment and management of neuropacemaker (brain) (peripheral nerve) (spinal cord): Secondary | ICD-10-CM | POA: Diagnosis not present

## 2021-07-09 DIAGNOSIS — R030 Elevated blood-pressure reading, without diagnosis of hypertension: Secondary | ICD-10-CM | POA: Diagnosis not present

## 2021-07-11 ENCOUNTER — Other Ambulatory Visit: Payer: Self-pay | Admitting: Neurosurgery

## 2021-07-22 NOTE — Progress Notes (Signed)
Surgical Instructions    Your procedure is scheduled on Friday, September 16th, 2022.   Report to [facility] Main Entrance "A" at 05:30 A.M., then check in with the Admitting office.  Call this number if you have problems the morning of surgery:  [phone]   If you have any questions prior to your surgery date call [phone]: Open Monday-Friday 8am-4pm    Remember:  Do not eat or drink after midnight the night before your surgery    Take these medicines the morning of surgery with A SIP OF WATER:  ALPRAZolam (XANAX)  cyclobenzaprine (FLEXERIL) escitalopram (LEXAPRO) LamoTRIgine 300  omeprazole (PRILOSEC)  phenytoin (DILANTIN) tiotropium (SPIRIVA) - please, bring the inhaler with you the day of surgery  If needed:  albuterol (PROVENTIL HFA;VENTOLIN HFA) - please, bring the inhaler with you the day of surgery diphenhydrAMINE (BENADRYL) rizatriptan (MAXALT-MLT)   As of today, STOP taking any Aspirin (unless otherwise instructed by your surgeon) Aleve, Naproxen, Ibuprofen, Motrin, Advil, Goody's, BC's, all herbal medications, fish oil, and all vitamins.           Do not wear jewelry  Do not wear lotions, powders, colognes, or deodorant. Men may shave face and neck. Do not bring valuables to the hospital.              [facility] is not responsible for any belongings or valuables.  Do NOT Smoke (Tobacco/Vaping)  24 hours prior to your procedure If you use a CPAP at night, you may bring your mask for your overnight stay.   Contacts, glasses, dentures or bridgework may not be worn into surgery, please bring cases for these belongings   For patients admitted to the hospital, discharge time will be determined by your treatment team.   Patients discharged the day of surgery will not be allowed to drive home, and someone needs to stay with them for 24 hours.  ONLY 1 SUPPORT PERSON MAY BE PRESENT WHILE YOU ARE IN SURGERY. IF YOU ARE TO BE ADMITTED ONCE YOU ARE IN YOUR  ROOM YOU WILL BE ALLOWED TWO (2) VISITORS.  Minor children may have two parents present. Special consideration for safety and communication needs will be reviewed on a case by case basis.  Special instructions:    Oral Hygiene is also important to reduce your risk of infection.  Remember - BRUSH YOUR TEETH THE MORNING OF SURGERY WITH YOUR REGULAR TOOTHPASTE   [facility]- Preparing For Surgery  Before surgery, you can play an important role. Because skin is not sterile, your skin needs to be as free of germs as possible. You can reduce the number of germs on your skin by washing with CHG (chlorahexidine gluconate) Soap before surgery.  CHG is an antiseptic cleaner which kills germs and bonds with the skin to continue killing germs even after washing.     Please do not use if you have an allergy to CHG or antibacterial soaps. If your skin becomes reddened/irritated stop using the CHG.  Do not shave (including legs and underarms) for at least 48 hours prior to first CHG shower. It is OK to shave your face.  Please follow these instructions carefully.     Shower the NIGHT BEFORE SURGERY and the MORNING OF SURGERY with CHG Soap.   If you chose to wash your hair, wash your hair first as usual with your normal shampoo. After you shampoo, rinse your hair and body thoroughly to remove the shampoo.  Then [employer] and genitals (private  parts) with your normal soap and rinse thoroughly to remove soap.  After that Use CHG Soap as you would any other liquid soap. You can apply CHG directly to the skin and wash gently with a scrungie or a clean washcloth.   Apply the CHG Soap to your body ONLY FROM THE NECK DOWN.  Do not use on open wounds or open sores. Avoid contact with your eyes, ears, mouth and genitals (private parts). Wash Face and genitals (private parts)  with your normal soap.   Wash thoroughly, paying special attention to the area where your surgery will be performed.  Thoroughly rinse your  body with warm water from the neck down.  DO NOT shower/wash with your normal soap after using and rinsing off the CHG Soap.  Pat yourself dry with a CLEAN TOWEL.  Wear CLEAN PAJAMAS to bed the night before surgery  Place CLEAN SHEETS on your bed the night before your surgery  DO NOT SLEEP WITH PETS.   Day of Surgery:  Take a shower with CHG soap. Wear Clean/Comfortable clothing the morning of surgery Do not apply any deodorants/lotions.   Remember to brush your teeth WITH YOUR REGULAR TOOTHPASTE.   Please read over the following fact sheets that you were given.

## 2021-07-23 ENCOUNTER — Encounter (HOSPITAL_COMMUNITY)
Admission: RE | Admit: 2021-07-23 | Discharge: 2021-07-23 | Disposition: A | Discharge status: Home / Self Care | Payer: Medicare Other | Source: Ambulatory Visit | Attending: Neurosurgery | Admitting: Neurosurgery

## 2021-07-23 ENCOUNTER — Other Ambulatory Visit: Payer: Self-pay

## 2021-07-23 ENCOUNTER — Encounter (HOSPITAL_COMMUNITY): Payer: Self-pay

## 2021-07-23 DIAGNOSIS — Z20822 Contact with and (suspected) exposure to covid-19: Secondary | ICD-10-CM | POA: Diagnosis not present

## 2021-07-23 DIAGNOSIS — Z01818 Encounter for other preprocedural examination: Secondary | ICD-10-CM | POA: Insufficient documentation

## 2021-07-23 LAB — CBC
HCT: 51.9 % (ref 39.0–52.0)
Hemoglobin: 17 g/dL (ref 13.0–17.0)
MCH: 31 pg (ref 26.0–34.0)
MCHC: 32.8 g/dL (ref 30.0–36.0)
MCV: 94.7 fL (ref 80.0–100.0)
Platelets: 239 10*3/uL (ref 150–400)
RBC: 5.48 MIL/uL (ref 4.22–5.81)
RDW: 13.8 % (ref 11.5–15.5)
WBC: 8.1 10*3/uL (ref 4.0–10.5)
nRBC: 0 % (ref 0.0–0.2)

## 2021-07-23 LAB — BASIC METABOLIC PANEL
Anion gap: 7 (ref 5–15)
BUN: 11 mg/dL (ref 6–20)
CO2: 29 mmol/L (ref 22–32)
Calcium: 9.5 mg/dL (ref 8.9–10.3)
Chloride: 102 mmol/L (ref 98–111)
Creatinine, Ser: 0.87 mg/dL (ref 0.61–1.24)
GFR, Estimated: >60 mL/min (ref >60)
Glucose, Bld: 96 mg/dL (ref 70–99)
Potassium: 4.6 mmol/L (ref 3.5–5.1)
Sodium: 138 mmol/L (ref 135–145)

## 2021-07-23 LAB — NO BLOOD PRODUCTS

## 2021-07-23 LAB — SARS CORONAVIRUS 2 (TAT 6-24 HRS): SARS Coronavirus 2: NEGATIVE

## 2021-07-23 NOTE — Progress Notes (Addendum)
PCP - Avon Gully MD Cardiologist - none Neurologist- Beryle Beams MD  PPM/ICD - denies Device Orders -  Rep Notified -   Chest x-ray -  EKG - 07/23/21 Stress Test -  ECHO -  Cardiac Cath -   Sleep Study - denies CPAP -   Fasting Blood Sugar - n/a Checks Blood Sugar _____ times a day  Blood Thinner Instructions:n/a Aspirin Instructions:  ERAS Protcol -no PRE-SURGERY Ensure or G2-   COVID TEST- yes-07/23/21   Anesthesia review: no  Patient denies shortness of breath, fever, cough and chest pain at PAT appointment   All instructions explained to the patient, with a verbal understanding of the material. Patient agrees to go over the instructions while at home for a better understanding. Patient also instructed to self quarantine after being tested for COVID-19. The opportunity to ask questions was provided.

## 2021-07-24 NOTE — Anesthesia Preprocedure Evaluation (Addendum)
Anesthesia Evaluation  Patient identified by MRN, date of birth, ID band Patient awake    Reviewed: Allergy & Precautions, H&P , NPO status , Patient's Chart, lab work & pertinent test results  Airway Mallampati: II  TM Distance: >3 FB Neck ROM: Full    Dental no notable dental hx. (+) Edentulous Upper, Edentulous Lower   Pulmonary neg pulmonary ROS, Current Smoker,    Pulmonary exam normal breath sounds clear to auscultation       Cardiovascular Exercise Tolerance: Good negative cardio ROS Normal cardiovascular exam Rhythm:Regular Rate:Normal     Neuro/Psych Seizures -, Well Controlled,  PSYCHIATRIC DISORDERS Anxiety Depression    GI/Hepatic negative GI ROS, Neg liver ROS,   Endo/Other  negative endocrine ROS  Renal/GU negative Renal ROS  negative genitourinary   Musculoskeletal  (+) Arthritis , Osteoarthritis and Rheumatoid disorders,    Abdominal   Peds negative pediatric ROS (+)  Hematology negative hematology ROS (+)   Anesthesia Other Findings   Reproductive/Obstetrics negative OB ROS                            Anesthesia Physical Anesthesia Plan  ASA: 3  Anesthesia Plan: General   Post-op Pain Management:    Induction: Intravenous  PONV Risk Score and Plan: 1 and Ondansetron and Dexamethasone  Airway Management Planned: Oral ETT and LMA  Additional Equipment: None  Intra-op Plan:   Post-operative Plan: Extubation in OR  Informed Consent: I have reviewed the patients History and Physical, chart, labs and discussed the procedure including the risks, benefits and alternatives for the proposed anesthesia with the patient or authorized representative who has indicated his/her understanding and acceptance.       Plan Discussed with: Anesthesiologist and CRNA  Anesthesia Plan Comments: (  )       Anesthesia Quick Evaluation

## 2021-07-25 ENCOUNTER — Ambulatory Visit (HOSPITAL_COMMUNITY): Payer: Medicare Other | Admitting: Anesthesiology

## 2021-07-25 ENCOUNTER — Ambulatory Visit (HOSPITAL_COMMUNITY)
Admission: RE | Admit: 2021-07-25 | Discharge: 2021-07-25 | Disposition: A | Discharge status: Home / Self Care | Payer: Medicare Other | Attending: Neurosurgery | Admitting: Neurosurgery

## 2021-07-25 ENCOUNTER — Encounter (HOSPITAL_COMMUNITY)
Admission: RE | Disposition: A | Discharge status: Home / Self Care | Payer: Self-pay | Source: Home / Self Care | Attending: Neurosurgery

## 2021-07-25 DIAGNOSIS — Z4542 Encounter for adjustment and management of neuropacemaker (brain) (peripheral nerve) (spinal cord): Secondary | ICD-10-CM | POA: Insufficient documentation

## 2021-07-25 DIAGNOSIS — F17210 Nicotine dependence, cigarettes, uncomplicated: Secondary | ICD-10-CM | POA: Diagnosis not present

## 2021-07-25 DIAGNOSIS — G40919 Epilepsy, unspecified, intractable, without status epilepticus: Secondary | ICD-10-CM | POA: Diagnosis not present

## 2021-07-25 DIAGNOSIS — Z79899 Other long term (current) drug therapy: Secondary | ICD-10-CM | POA: Diagnosis not present

## 2021-07-25 DIAGNOSIS — Z880 Allergy status to penicillin: Secondary | ICD-10-CM | POA: Diagnosis not present

## 2021-07-25 DIAGNOSIS — E785 Hyperlipidemia, unspecified: Secondary | ICD-10-CM | POA: Diagnosis not present

## 2021-07-25 HISTORY — PX: VAGUS NERVE STIMULATOR INSERTION: SHX348

## 2021-07-25 SURGERY — VAGAL NERVE STIMULATOR IMPLANT
Anesthesia: General

## 2021-07-25 MED ORDER — FENTANYL CITRATE (PF) 100 MCG/2ML IJ SOLN
INTRAMUSCULAR | Status: AC
  Filled 2021-07-25: qty 2

## 2021-07-25 MED ORDER — ONDANSETRON HCL 4 MG/2ML IJ SOLN
INTRAMUSCULAR | Status: DC | PRN
  Administered 2021-07-25: 4 mg via INTRAVENOUS

## 2021-07-25 MED ORDER — EPHEDRINE SULFATE-NACL 50-0.9 MG/10ML-% IV SOSY
PREFILLED_SYRINGE | INTRAVENOUS | Status: DC | PRN
Start: 2021-07-25 — End: 2021-07-25
  Administered 2021-07-25 (×2): 10 mg via INTRAVENOUS
  Administered 2021-07-25 (×2): 15 mg via INTRAVENOUS

## 2021-07-25 MED ORDER — CHLORHEXIDINE GLUCONATE CLOTH 2 % EX PADS: 6.0000 | MEDICATED_PAD | Freq: Once | CUTANEOUS | Status: DC

## 2021-07-25 MED ORDER — LIDOCAINE 2% (20 MG/ML) 5 ML SYRINGE
INTRAMUSCULAR | Status: DC | PRN
Start: 2021-07-25 — End: 2021-07-25
  Administered 2021-07-25: 100 mg via INTRAVENOUS

## 2021-07-25 MED ORDER — DEXAMETHASONE SODIUM PHOSPHATE 10 MG/ML IJ SOLN
INTRAMUSCULAR | Status: DC | PRN
  Administered 2021-07-25: 10 mg via INTRAVENOUS

## 2021-07-25 MED ORDER — PROPOFOL 10 MG/ML IV BOLUS
INTRAVENOUS | Status: DC | PRN
  Administered 2021-07-25: 200 mg via INTRAVENOUS

## 2021-07-25 MED ORDER — ORAL CARE MOUTH RINSE: 15.0000 mL | Freq: Once | OROMUCOSAL | Status: AC

## 2021-07-25 MED ORDER — PHENYLEPHRINE 40 MCG/ML (10ML) SYRINGE FOR IV PUSH (FOR BLOOD PRESSURE SUPPORT)
PREFILLED_SYRINGE | INTRAVENOUS | Status: DC | PRN
  Administered 2021-07-25: 200 ug via INTRAVENOUS
  Administered 2021-07-25: 80 ug via INTRAVENOUS
  Administered 2021-07-25: 120 ug via INTRAVENOUS
  Administered 2021-07-25: 160 ug via INTRAVENOUS

## 2021-07-25 MED ORDER — OXYCODONE HCL 5 MG PO TABS
5.0000 mg | ORAL_TABLET | Freq: Once | ORAL | Status: AC | PRN
Start: 2021-07-25 — End: 2021-07-25
  Administered 2021-07-25: 5 mg via ORAL

## 2021-07-25 MED ORDER — OXYCODONE HCL 5 MG PO TABS
ORAL_TABLET | ORAL | Status: AC
  Filled 2021-07-25: qty 1

## 2021-07-25 MED ORDER — ONDANSETRON HCL 4 MG/2ML IJ SOLN: 4.0000 mg | Freq: Once | INTRAMUSCULAR | Status: DC | PRN

## 2021-07-25 MED ORDER — MIDAZOLAM HCL 2 MG/2ML IJ SOLN
INTRAMUSCULAR | Status: AC
  Filled 2021-07-25: qty 2

## 2021-07-25 MED ORDER — THROMBIN 5000 UNITS EX SOLR
CUTANEOUS | Status: AC
  Filled 2021-07-25: qty 5000

## 2021-07-25 MED ORDER — GLYCOPYRROLATE PF 0.2 MG/ML IJ SOSY
PREFILLED_SYRINGE | INTRAMUSCULAR | Status: AC
  Filled 2021-07-25: qty 1

## 2021-07-25 MED ORDER — MEPERIDINE HCL 25 MG/ML IJ SOLN: 6.2500 mg | INTRAMUSCULAR | Status: DC | PRN

## 2021-07-25 MED ORDER — CHLORHEXIDINE GLUCONATE 0.12 % MT SOLN
15.0000 mL | Freq: Once | OROMUCOSAL | Status: AC
  Administered 2021-07-25: 15 mL via OROMUCOSAL
  Filled 2021-07-25: qty 15

## 2021-07-25 MED ORDER — ACETAMINOPHEN 325 MG PO TABS: 325.0000 mg | ORAL_TABLET | ORAL | Status: DC | PRN

## 2021-07-25 MED ORDER — THROMBIN 5000 UNITS EX SOLR
OROMUCOSAL | Status: DC | PRN
  Administered 2021-07-25: 5 mL via TOPICAL

## 2021-07-25 MED ORDER — 0.9 % SODIUM CHLORIDE (POUR BTL) OPTIME
TOPICAL | Status: DC | PRN
  Administered 2021-07-25: 1000 mL

## 2021-07-25 MED ORDER — PHENYLEPHRINE 40 MCG/ML (10ML) SYRINGE FOR IV PUSH (FOR BLOOD PRESSURE SUPPORT)
PREFILLED_SYRINGE | INTRAVENOUS | Status: AC
  Filled 2021-07-25: qty 10

## 2021-07-25 MED ORDER — VANCOMYCIN HCL IN DEXTROSE 1-5 GM/200ML-% IV SOLN
1000.0000 mg | INTRAVENOUS | Status: AC
Start: 2021-07-25 — End: 2021-07-25
  Administered 2021-07-25: 1000 mg via INTRAVENOUS
  Filled 2021-07-25: qty 200

## 2021-07-25 MED ORDER — DEXAMETHASONE SODIUM PHOSPHATE 10 MG/ML IJ SOLN
INTRAMUSCULAR | Status: AC
  Filled 2021-07-25: qty 1

## 2021-07-25 MED ORDER — ONDANSETRON HCL 4 MG/2ML IJ SOLN
INTRAMUSCULAR | Status: AC
  Filled 2021-07-25: qty 2

## 2021-07-25 MED ORDER — BUPIVACAINE HCL (PF) 0.5 % IJ SOLN
INTRAMUSCULAR | Status: AC
  Filled 2021-07-25: qty 30

## 2021-07-25 MED ORDER — LIDOCAINE-EPINEPHRINE 1 %-1:100000 IJ SOLN
INTRAMUSCULAR | Status: AC
  Filled 2021-07-25: qty 1

## 2021-07-25 MED ORDER — GLYCOPYRROLATE 0.2 MG/ML IJ SOLN
INTRAMUSCULAR | Status: DC | PRN
  Administered 2021-07-25: .2 mg via INTRAVENOUS

## 2021-07-25 MED ORDER — EPHEDRINE 5 MG/ML INJ
INTRAVENOUS | Status: AC
  Filled 2021-07-25: qty 5

## 2021-07-25 MED ORDER — HYDROCODONE-ACETAMINOPHEN 5-325 MG PO TABS: 1.0000 | ORAL_TABLET | Freq: Four times a day (QID) | ORAL | 0 refills | Status: AC | PRN

## 2021-07-25 MED ORDER — LIDOCAINE-EPINEPHRINE 1 %-1:100000 IJ SOLN
INTRAMUSCULAR | Status: DC | PRN
  Administered 2021-07-25: 3 mL

## 2021-07-25 MED ORDER — ACETAMINOPHEN 160 MG/5ML PO SOLN: 325.0000 mg | ORAL | Status: DC | PRN

## 2021-07-25 MED ORDER — BUPIVACAINE HCL 0.5 % IJ SOLN
INTRAMUSCULAR | Status: DC | PRN
  Administered 2021-07-25: 3 mL

## 2021-07-25 MED ORDER — MIDAZOLAM HCL 5 MG/5ML IJ SOLN
INTRAMUSCULAR | Status: DC | PRN
  Administered 2021-07-25: 2 mg via INTRAVENOUS

## 2021-07-25 MED ORDER — PROPOFOL 10 MG/ML IV BOLUS
INTRAVENOUS | Status: AC
  Filled 2021-07-25: qty 20

## 2021-07-25 MED ORDER — FENTANYL CITRATE (PF) 100 MCG/2ML IJ SOLN
25.0000 ug | INTRAMUSCULAR | Status: DC | PRN
  Administered 2021-07-25: 25 ug via INTRAVENOUS

## 2021-07-25 MED ORDER — FENTANYL CITRATE (PF) 250 MCG/5ML IJ SOLN
INTRAMUSCULAR | Status: AC
  Filled 2021-07-25: qty 5

## 2021-07-25 MED ORDER — FENTANYL CITRATE (PF) 250 MCG/5ML IJ SOLN
INTRAMUSCULAR | Status: DC | PRN
  Administered 2021-07-25 (×5): 50 ug via INTRAVENOUS

## 2021-07-25 MED ORDER — LACTATED RINGERS IV SOLN: INTRAVENOUS | Status: DC

## 2021-07-25 MED ORDER — OXYCODONE HCL 5 MG/5ML PO SOLN
5.0000 mg | Freq: Once | ORAL | Status: AC | PRN
Start: 2021-07-25 — End: 2021-07-25

## 2021-07-25 SURGICAL SUPPLY — 54 items
ADH SKN CLS APL DERMABOND .7 (GAUZE/BANDAGES/DRESSINGS) ×1
APL SKNCLS STERI-STRIP NONHPOA (GAUZE/BANDAGES/DRESSINGS) ×1
BAG COUNTER SPONGE SURGICOUNT (BAG) ×3 IMPLANT
BAG SPNG CNTER NS LX DISP (BAG) ×2
BENZOIN TINCTURE PRP APPL 2/3 (GAUZE/BANDAGES/DRESSINGS) ×1 IMPLANT
BLADE CLIPPER SURG (BLADE) IMPLANT
BLADE SURG 11 STRL SS (BLADE) ×1 IMPLANT
CANISTER SUCT 3000ML PPV (MISCELLANEOUS) ×2 IMPLANT
CARTRIDGE OIL MAESTRO DRILL (MISCELLANEOUS) IMPLANT
DECANTER SPIKE VIAL GLASS SM (MISCELLANEOUS) ×1 IMPLANT
DERMABOND ADVANCED (GAUZE/BANDAGES/DRESSINGS) ×1
DERMABOND ADVANCED .7 DNX12 (GAUZE/BANDAGES/DRESSINGS) ×1 IMPLANT
DIFFUSER DRILL AIR PNEUMATIC (MISCELLANEOUS) IMPLANT
DRAPE CAMERA VIDEO/LASER (DRAPES) ×2 IMPLANT
DRAPE HALF SHEET 40X57 (DRAPES) IMPLANT
DRAPE LAPAROTOMY 100X72 PEDS (DRAPES) ×2 IMPLANT
DRSG OPSITE POSTOP 3X4 (GAUZE/BANDAGES/DRESSINGS) ×1 IMPLANT
DRSG TEGADERM 4X4.75 (GAUZE/BANDAGES/DRESSINGS) ×4 IMPLANT
DURAPREP 6ML APPLICATOR 50/CS (WOUND CARE) ×2 IMPLANT
ELECT COATED BLADE 2.86 ST (ELECTRODE) ×2 IMPLANT
ELECT REM PT RETURN 9FT ADLT (ELECTROSURGICAL) ×2
ELECTRODE REM PT RTRN 9FT ADLT (ELECTROSURGICAL) ×1 IMPLANT
GAUZE 4X4 16PLY ~~LOC~~+RFID DBL (SPONGE) ×1 IMPLANT
GENERATOR MODEL 106 ASPIRE (Neuro Prosthesis/Implant) ×1 IMPLANT
GLOVE SURG ENC MOIS LTX SZ7.5 (GLOVE) ×2 IMPLANT
GLOVE SURG LTX SZ7 (GLOVE) ×2 IMPLANT
GLOVE SURG UNDER POLY LF SZ7.5 (GLOVE) ×4 IMPLANT
GOWN STRL REUS W/ TWL LRG LVL3 (GOWN DISPOSABLE) ×2 IMPLANT
GOWN STRL REUS W/ TWL XL LVL3 (GOWN DISPOSABLE) IMPLANT
GOWN STRL REUS W/TWL 2XL LVL3 (GOWN DISPOSABLE) IMPLANT
GOWN STRL REUS W/TWL LRG LVL3 (GOWN DISPOSABLE) ×4
GOWN STRL REUS W/TWL XL LVL3 (GOWN DISPOSABLE)
HEMOSTAT POWDER KIT SURGIFOAM (HEMOSTASIS) ×2 IMPLANT
KIT BASIN OR (CUSTOM PROCEDURE TRAY) ×2 IMPLANT
KIT TURNOVER KIT B (KITS) ×2 IMPLANT
LOOP VESSEL MAXI BLUE (MISCELLANEOUS) IMPLANT
LOOP VESSEL MINI RED (MISCELLANEOUS) IMPLANT
NEEDLE HYPO 22GX1.5 SAFETY (NEEDLE) ×2 IMPLANT
NS IRRIG 1000ML POUR BTL (IV SOLUTION) ×2 IMPLANT
OIL CARTRIDGE MAESTRO DRILL (MISCELLANEOUS)
PACK LAMINECTOMY NEURO (CUSTOM PROCEDURE TRAY) ×2 IMPLANT
PAD ARMBOARD 7.5X6 YLW CONV (MISCELLANEOUS) ×6 IMPLANT
SPONGE INTESTINAL PEANUT (DISPOSABLE) IMPLANT
SPONGE SURGIFOAM ABS GEL SZ50 (HEMOSTASIS) IMPLANT
SPONGE T-LAP 4X18 ~~LOC~~+RFID (SPONGE) ×1 IMPLANT
SUT ETHILON 3 0 FSL (SUTURE) IMPLANT
SUT NURALON 4 0 TR CR/8 (SUTURE) ×2 IMPLANT
SUT VIC AB 0 CT1 27 (SUTURE)
SUT VIC AB 0 CT1 27XBRD ANBCTR (SUTURE) ×1 IMPLANT
SUT VIC AB 3-0 SH 8-18 (SUTURE) ×2 IMPLANT
SUT VICRYL 3-0 RB1 18 ABS (SUTURE) ×3 IMPLANT
TOWEL GREEN STERILE (TOWEL DISPOSABLE) ×2 IMPLANT
TOWEL GREEN STERILE FF (TOWEL DISPOSABLE) ×2 IMPLANT
WATER STERILE IRR 1000ML POUR (IV SOLUTION) ×2 IMPLANT

## 2021-07-25 NOTE — H&P (Signed)
Chief Complaint  Epilepsy  History of Present Illness  Jeffrey Wilcox is a 55 y.o. male with a history of medically intractable epilepsy who previously underwent placement of a left vagal nerve stimulator several years ago.  Recent interrogation at his neurologist office revealed near end-of-life of the battery.  He presents today for battery change.  Past Medical History   Past Medical History:  Diagnosis Date   Anxiety    takes Xanax daily as needed   Arthritis, rheumatoid (HCC)    Chronic back pain    DDD   Degenerative disc disease    Degenerative disc disease    Dizziness    r/t seizures per pt   Enlarged prostate    takes Flomax daily   History of bronchitis many yrs ago   History of colon polyps    benign   History of migraine    last one a month ago-takes Maxalt if needed   Hyperlipidemia    takes Simvastatin nightly   Joint pain    Joint swelling    Muscle spasm    takes Flexeril daily as needed   Osteoarthritis    Pneumonia at age 38   "walking"   PTSD (post-traumatic stress disorder)    takes Lexapro daily   Seizures (HCC)    takes Dilantin and Lamictal daily;last seizure was 2 months ago   Severe depression (HCC)    takes Brintellix daily   Shortness of breath dyspnea    with exertion;has Spiriva and Albuterol if needed   Slow urinary stream     Past Surgical History   Past Surgical History:  Procedure Laterality Date   COLONOSCOPY     INGUINAL HERNIA REPAIR  01/06/2012   Procedure: HERNIA REPAIR INGUINAL ADULT;  Surgeon: Dalia Heading, MD;  Location: [facility];  Service: General;  Laterality: Right;   MULTIPLE TOOTH EXTRACTIONS     TONSILLECTOMY     VAGUS NERVE STIMULATOR INSERTION N/A 07/26/2015   Procedure: VAGAL NERVE STIMULATOR PLACEMENT;  Surgeon: Lisbeth Renshaw, MD;  Location: [facility];  Service: Neurosurgery;  Laterality: N/A;  vagal nerve stimulator placement   VAGUS NERVE STIMULATOR INSERTION Left 2018    Social History    Social History   Tobacco Use   Smoking status: Every Day    Packs/day: 0.50    Years: 33.00    Pack years: 16.50    Types: Cigarettes   Smokeless tobacco: Former  Building services engineer Use: Never used  Substance Use Topics   Alcohol use: No    Comment: recovering alcoholic for 12 years   Drug use: Yes    Types: Marijuana    Comment: last time was Easter Sunday    Medications   Prior to Admission medications   Medication Sig Start Date End Date Taking? Authorizing Provider  albuterol (PROVENTIL HFA;VENTOLIN HFA) 108 (90 BASE) MCG/ACT inhaler Inhale 1-2 puffs into the lungs every 6 (six) hours as needed for wheezing or shortness of breath.   Yes [provider]  ALPRAZolam Prudy Feeler) 1 MG tablet Take 1 mg by mouth 4 (four) times daily as needed. For anxiety/sleep 09/22/13  Yes [provider]  cholecalciferol (VITAMIN D3) 25 MCG (1000 UNIT) tablet Take 1,000 Units by mouth daily.   Yes [provider]  cyclobenzaprine (FLEXERIL) 10 MG tablet Take 10 mg by mouth 2 (two) times a day. Muscle Spasms.   Yes [provider]  diphenhydrAMINE (BENADRYL) 25 mg capsule Take 25 mg  by mouth daily as needed for allergies.   Yes [provider]  DULoxetine (CYMBALTA) 30 MG capsule Take 60 mg by mouth at bedtime. 06/25/21  Yes [provider]  escitalopram (LEXAPRO) 20 MG tablet Take 20 mg by mouth 2 (two) times a day. 03/31/19  Yes [provider]  LamoTRIgine 300 MG TB24 24 hour tablet Take 300 mg by mouth 2 (two) times a day. 01/02/19  Yes [provider]  omeprazole (PRILOSEC) 20 MG capsule Take 20 mg by mouth daily. 04/02/19  Yes [provider]  phenytoin (DILANTIN) 300 MG ER capsule Take 300 mg by mouth 2 (two) times daily.    Yes [provider]  rizatriptan (MAXALT-MLT) 10 MG disintegrating tablet Take 10 mg by mouth as needed for migraine. May repeat in 2 hours if needed   Yes [provider]   simvastatin (ZOCOR) 20 MG tablet Take 20 mg by mouth at bedtime.    Yes [provider]  tiotropium (SPIRIVA) 18 MCG inhalation capsule Place 18 mcg into inhaler and inhale 2 (two) times a day.    Yes [provider]    Allergies   Allergies  Allergen Reactions   Other Anaphylaxis and Rash    [place], roses    Penicillins Other (See Comments)    Hallucination    Review of Systems  ROS  Neurologic Exam  Awake, alert, oriented Memory and concentration grossly intact Speech fluent, appropriate CN grossly intact Motor exam: Upper Extremities Deltoid Bicep Tricep Grip  Right 5/5 5/5 5/5 5/5  Left 5/5 5/5 5/5 5/5   Lower Extremities IP Quad PF DF EHL  Right 5/5 5/5 5/5 5/5 5/5  Left 5/5 5/5 5/5 5/5 5/5   Sensation grossly intact to LT  Impression  - 55 y.o. male with medically intractable epilepsy status post VNS placement with recent interrogation revealing near end-of-life of the generator.  Plan  -We will plan on proceeding with replacement of the left VNS generator  I have reviewed the indications for surgery as well as the expected postoperative course and recovery.  We have discussed the details of the surgery, risks, benefits, and alternatives.  All his questions today were answered.  He provided informed consent to proceed.  Lisbeth Renshaw, MD [facility]

## 2021-07-25 NOTE — Transfer of Care (Signed)
Immediate Anesthesia Transfer of Care Note  Patient: Jeffrey Wilcox  Procedure(s) Performed: VAGAL NERVE STIMULATOR BATTERY CHANGE  Patient Location: PACU  Anesthesia Type:General  Level of Consciousness: drowsy and patient cooperative  Airway & Oxygen Therapy: Patient Spontanous Breathing  Post-op Assessment: Report given to RN and Post -op Vital signs reviewed and stable  Post vital signs: Reviewed and stable  Last Vitals:  Vitals Value Taken Time  BP 166/100 07/25/21 0906  Temp    Pulse 83 07/25/21 0906  Resp 17 07/25/21 0906  SpO2 99 % 07/25/21 0906  Vitals shown include unvalidated device data.  Last Pain:  Vitals:   07/25/21 0622  TempSrc: Oral  PainSc: 7       Patients Stated Pain Goal: 1 (07/25/21 0622)  Complications: No notable events documented.

## 2021-07-25 NOTE — Discharge Summary (Signed)
Physician Discharge Summary  Patient ID: Jeffrey Wilcox MRN: [id number] DOB/AGE: 1966/03/16 55 y.o.  Admit date: 07/25/2021 Discharge date: 07/25/2021  Admission Diagnoses:  Medically intractable epilepsy VNS battery end-of-life  Discharge Diagnoses:  Same Active Problems:   * No active hospital problems. *   Discharged Condition: Stable  Hospital Course:  Jeffrey Wilcox is a 55 y.o. male who underwent uncomplicated replacement of a VNS generator. He was at baseline postop and discharged home from PACU in stable condition.  Treatments: Surgery - Replacement of left VNS generator   Discharge Exam: Blood pressure (!) 153/98, pulse 81, temperature 97.6 F (36.4 C), resp. rate 15, height 5\' 11"  (1.803 m), weight 86.9 kg, SpO2 98 %. Awake, alert, oriented Speech fluent, appropriate CN grossly intact 5/5 BUE/BLE Wound c/d/i  Disposition: Discharge disposition: 01-Home or Self Care       Discharge Instructions     Call MD for:  redness, tenderness, or signs of infection (pain, swelling, redness, odor or green/yellow discharge around incision site)   Complete by: As directed    Call MD for:  temperature >100.4   Complete by: As directed    Diet - low sodium heart healthy   Complete by: As directed    Discharge instructions   Complete by: As directed    Walk at home as much as possible, at least 4 times / day   Increase activity slowly   Complete by: As directed    Lifting restrictions   Complete by: As directed    No lifting > 10 lbs   May shower / Bathe   Complete by: As directed    48 hours after surgery   May walk up steps   Complete by: As directed    Other Restrictions   Complete by: As directed    No bending/twisting at waist   Remove dressing in 48 hours   Complete by: As directed       Allergies as of 07/25/2021       Reactions   Other Anaphylaxis, Rash   07/27/2021, roses   Penicillins Other (See Comments)   Hallucination        Medication  List     TAKE these medications    albuterol 108 (90 Base) MCG/ACT inhaler Commonly known as: VENTOLIN HFA Inhale 1-2 puffs into the lungs every 6 (six) hours as needed for wheezing or shortness of breath.   ALPRAZolam 1 MG tablet Commonly known as: XANAX Take 1 mg by mouth 4 (four) times daily as needed. For anxiety/sleep   cholecalciferol 25 MCG (1000 UNIT) tablet Commonly known as: VITAMIN D3 Take 1,000 Units by mouth daily.   cyclobenzaprine 10 MG tablet Commonly known as: FLEXERIL Take 10 mg by mouth 2 (two) times a day. Muscle Spasms.   diphenhydrAMINE 25 mg capsule Commonly known as: BENADRYL Take 25 mg by mouth daily as needed for allergies.   DULoxetine 30 MG capsule Commonly known as: CYMBALTA Take 60 mg by mouth at bedtime.   escitalopram 20 MG tablet Commonly known as: LEXAPRO Take 20 mg by mouth 2 (two) times a day.   HYDROcodone-acetaminophen 5-325 MG tablet Commonly known as: NORCO/VICODIN Take 1 tablet by mouth every 6 (six) hours as needed for moderate pain.   LamoTRIgine 300 MG Tb24 24 hour tablet Take 300 mg by mouth 2 (two) times a day.   omeprazole 20 MG capsule Commonly known as: PRILOSEC Take 20 mg by mouth daily.   phenytoin 300 MG  ER capsule Commonly known as: DILANTIN Take 300 mg by mouth 2 (two) times daily.   rizatriptan 10 MG disintegrating tablet Commonly known as: MAXALT-MLT Take 10 mg by mouth as needed for migraine. May repeat in 2 hours if needed   simvastatin 20 MG tablet Commonly known as: ZOCOR Take 20 mg by mouth at bedtime.   tiotropium 18 MCG inhalation capsule Commonly known as: SPIRIVA Place 18 mcg into inhaler and inhale 2 (two) times a day.        Follow-up Information     Lisbeth Renshaw, MD Follow up in 2 week(s).   Specialty: Neurosurgery Contact information: [address] [place] [address] [phone]                 Signed: Jackelyn Hoehn 07/25/2021, 9:30  AM

## 2021-07-25 NOTE — Op Note (Signed)
  NEUROSURGERY OPERATIVE NOTE   PREOP DIAGNOSIS:  VNS battery end-of-life Medically intractable epilepsy   POSTOP DIAGNOSIS: Same  PROCEDURE: 1. Replacement of left Vagal nerve stimulator battery  SURGEON: Dr. Lisbeth Renshaw, MD  ASSISTANT: None  ANESTHESIA: General Endotracheal  EBL: Minimal  SPECIMENS: None  DRAINS: None  COMPLICATIONS: None immediate  CONDITION: Hemodynamically stable to PACU  HISTORY: Jeffrey Wilcox is a 55 y.o. who was initially seen in the outpatient clinic who previously underwent placement of a VNS several years ago. Recent interrogation revealed near end-of-life of the battery. The risks and benefits of the surgery were reviewed in detail. After all questions were answered, informed consent was obtained.  PROCEDURE IN DETAIL: After informed consent was obtained and witnessed, the patient was brought to the operating room. After induction of general anesthesia, the patient was positioned on the operative table in the supine position. All pressure points were meticulously padded. Left infraclavicular skin incision was then marked out and prepped and draped in the usual sterile fashion.  After timeout was conducted, skin incision was infiltrated with local anesthetic with epinephrine. Skin incision was then made sharply, the subcutaneous pocket was opened and the generator identified. This was explanted. The lead was removed and the new generator connected and replaced in the subcutaneous pocket. The new generator was interrogated and noted to have normal lead impedence, accurate hear rate detection and was reprogrammed with the previous settings. Wound was then irrigated with saline and closed with interrupted 3-0 Vicryl stitches. Skin was closed with dermabond.  At the end of the case all sponge, needle, cottonoid, and instrument counts were correct. The patient was then extubated, transferred to the stretcher, and taken to the postanesthesia care unit  in stable hemodynamic condition.   Lisbeth Renshaw, MD [facility]

## 2021-07-25 NOTE — Progress Notes (Signed)
Patient stated he would accept blood or blood products if needed. Blood bank notified.

## 2021-07-25 NOTE — Anesthesia Procedure Notes (Signed)
Procedure Name: LMA Insertion Date/Time: 07/25/2021 8:17 AM Performed by: Rosiland Oz, CRNA Pre-anesthesia Checklist: Patient identified, Emergency Drugs available, Suction available, Patient being monitored and Timeout performed Patient Re-evaluated:Patient Re-evaluated prior to induction Oxygen Delivery Method: Circle system utilized Preoxygenation: Pre-oxygenation with 100% oxygen Induction Type: IV induction LMA: LMA inserted LMA Size: 4.0 Number of attempts: 1 Placement Confirmation: positive ETCO2 and breath sounds checked- equal and bilateral Tube secured with: Tape Dental Injury: Teeth and Oropharynx as per pre-operative assessment

## 2021-07-25 NOTE — Anesthesia Postprocedure Evaluation (Signed)
    Anesthesia Post Note  Patient: Jeffrey Wilcox  Procedure(s) Performed: VAGAL NERVE STIMULATOR BATTERY CHANGE     Anesthesia Post Evaluation No notable events documented.  Last Vitals:  Vitals:   07/25/21 1000 07/25/21 1015  BP: 139/90 (!) 145/84  Pulse: 72 77  Resp: 12 17  Temp: 36.5 C   SpO2: 95% 96%    Last Pain:  Vitals:   07/25/21 0926  TempSrc:   PainSc: 9                  Edgar Reisz

## 2021-07-28 ENCOUNTER — Encounter (HOSPITAL_COMMUNITY): Payer: Self-pay | Admitting: Neurosurgery

## 2021-08-05 DIAGNOSIS — J449 Chronic obstructive pulmonary disease, unspecified: Secondary | ICD-10-CM | POA: Diagnosis not present

## 2021-08-05 DIAGNOSIS — I420 Dilated cardiomyopathy: Secondary | ICD-10-CM | POA: Diagnosis not present

## 2021-08-20 DIAGNOSIS — R251 Tremor, unspecified: Secondary | ICD-10-CM | POA: Diagnosis not present

## 2021-08-20 DIAGNOSIS — G43701 Chronic migraine without aura, not intractable, with status migrainosus: Secondary | ICD-10-CM | POA: Diagnosis not present

## 2021-08-20 DIAGNOSIS — M545 Low back pain, unspecified: Secondary | ICD-10-CM | POA: Diagnosis not present

## 2021-08-26 DIAGNOSIS — Z23 Encounter for immunization: Secondary | ICD-10-CM | POA: Diagnosis not present

## 2021-08-26 DIAGNOSIS — J449 Chronic obstructive pulmonary disease, unspecified: Secondary | ICD-10-CM | POA: Diagnosis not present

## 2021-08-26 DIAGNOSIS — R569 Unspecified convulsions: Secondary | ICD-10-CM | POA: Diagnosis not present

## 2021-08-26 DIAGNOSIS — F17200 Nicotine dependence, unspecified, uncomplicated: Secondary | ICD-10-CM | POA: Diagnosis not present

## 2021-08-26 DIAGNOSIS — F17210 Nicotine dependence, cigarettes, uncomplicated: Secondary | ICD-10-CM | POA: Diagnosis not present

## 2021-09-26 DIAGNOSIS — J449 Chronic obstructive pulmonary disease, unspecified: Secondary | ICD-10-CM | POA: Diagnosis not present

## 2021-09-26 DIAGNOSIS — R569 Unspecified convulsions: Secondary | ICD-10-CM | POA: Diagnosis not present

## 2021-10-16 ENCOUNTER — Encounter: Payer: Self-pay | Admitting: Internal Medicine

## 2021-10-23 ENCOUNTER — Ambulatory Visit: Payer: Medicare Other | Admitting: Gastroenterology

## 2021-10-26 DIAGNOSIS — F17210 Nicotine dependence, cigarettes, uncomplicated: Secondary | ICD-10-CM | POA: Diagnosis not present

## 2021-10-26 DIAGNOSIS — J449 Chronic obstructive pulmonary disease, unspecified: Secondary | ICD-10-CM | POA: Diagnosis not present

## 2021-11-26 ENCOUNTER — Encounter: Payer: Self-pay | Admitting: Internal Medicine

## 2021-11-26 ENCOUNTER — Ambulatory Visit: Payer: Medicare Other | Admitting: Internal Medicine

## 2021-11-26 DIAGNOSIS — J449 Chronic obstructive pulmonary disease, unspecified: Secondary | ICD-10-CM | POA: Diagnosis not present

## 2021-11-26 DIAGNOSIS — F17210 Nicotine dependence, cigarettes, uncomplicated: Secondary | ICD-10-CM | POA: Diagnosis not present

## 2021-12-27 DIAGNOSIS — J449 Chronic obstructive pulmonary disease, unspecified: Secondary | ICD-10-CM | POA: Diagnosis not present

## 2022-01-09 DIAGNOSIS — Z79891 Long term (current) use of opiate analgesic: Secondary | ICD-10-CM | POA: Diagnosis not present

## 2022-01-12 DIAGNOSIS — J449 Chronic obstructive pulmonary disease, unspecified: Secondary | ICD-10-CM | POA: Diagnosis not present

## 2022-01-12 DIAGNOSIS — Z1389 Encounter for screening for other disorder: Secondary | ICD-10-CM | POA: Diagnosis not present

## 2022-01-12 DIAGNOSIS — Z0001 Encounter for general adult medical examination with abnormal findings: Secondary | ICD-10-CM | POA: Diagnosis not present

## 2022-01-12 DIAGNOSIS — I10 Essential (primary) hypertension: Secondary | ICD-10-CM | POA: Diagnosis not present

## 2022-01-12 DIAGNOSIS — F17210 Nicotine dependence, cigarettes, uncomplicated: Secondary | ICD-10-CM | POA: Diagnosis not present

## 2022-01-26 ENCOUNTER — Other Ambulatory Visit (HOSPITAL_COMMUNITY): Payer: Self-pay | Admitting: Gerontology

## 2022-01-26 ENCOUNTER — Other Ambulatory Visit: Payer: Self-pay | Admitting: Gerontology

## 2022-01-26 DIAGNOSIS — Z87891 Personal history of nicotine dependence: Secondary | ICD-10-CM

## 2022-01-29 DIAGNOSIS — G4459 Other complicated headache syndrome: Secondary | ICD-10-CM | POA: Diagnosis not present

## 2022-01-29 DIAGNOSIS — R251 Tremor, unspecified: Secondary | ICD-10-CM | POA: Diagnosis not present

## 2022-01-29 DIAGNOSIS — Z79899 Other long term (current) drug therapy: Secondary | ICD-10-CM | POA: Diagnosis not present

## 2022-01-29 DIAGNOSIS — M25519 Pain in unspecified shoulder: Secondary | ICD-10-CM | POA: Diagnosis not present

## 2022-02-12 DIAGNOSIS — J449 Chronic obstructive pulmonary disease, unspecified: Secondary | ICD-10-CM | POA: Diagnosis not present

## 2022-02-17 ENCOUNTER — Ambulatory Visit: Payer: Medicare Other | Admitting: Gastroenterology

## 2022-03-06 ENCOUNTER — Ambulatory Visit (HOSPITAL_COMMUNITY): Admission: RE | Admit: 2022-03-06 | Payer: Medicare Other | Source: Ambulatory Visit

## 2022-03-06 ENCOUNTER — Encounter (HOSPITAL_COMMUNITY): Payer: Self-pay

## 2022-03-21 DIAGNOSIS — J449 Chronic obstructive pulmonary disease, unspecified: Secondary | ICD-10-CM | POA: Diagnosis not present

## 2022-03-21 DIAGNOSIS — E785 Hyperlipidemia, unspecified: Secondary | ICD-10-CM | POA: Diagnosis not present

## 2022-03-26 DIAGNOSIS — M25561 Pain in right knee: Secondary | ICD-10-CM | POA: Diagnosis not present

## 2022-03-26 DIAGNOSIS — G4459 Other complicated headache syndrome: Secondary | ICD-10-CM | POA: Diagnosis not present

## 2022-03-26 DIAGNOSIS — Z79899 Other long term (current) drug therapy: Secondary | ICD-10-CM | POA: Diagnosis not present

## 2022-03-26 DIAGNOSIS — M25519 Pain in unspecified shoulder: Secondary | ICD-10-CM | POA: Diagnosis not present

## 2022-03-26 DIAGNOSIS — R251 Tremor, unspecified: Secondary | ICD-10-CM | POA: Diagnosis not present

## 2022-03-30 ENCOUNTER — Encounter: Payer: Self-pay | Admitting: Orthopedic Surgery

## 2022-04-21 ENCOUNTER — Encounter: Payer: Self-pay | Admitting: Radiology

## 2022-04-21 DIAGNOSIS — J449 Chronic obstructive pulmonary disease, unspecified: Secondary | ICD-10-CM | POA: Diagnosis not present

## 2022-05-21 DIAGNOSIS — M549 Dorsalgia, unspecified: Secondary | ICD-10-CM | POA: Diagnosis not present

## 2022-05-21 DIAGNOSIS — J449 Chronic obstructive pulmonary disease, unspecified: Secondary | ICD-10-CM | POA: Diagnosis not present

## 2022-06-21 DIAGNOSIS — I10 Essential (primary) hypertension: Secondary | ICD-10-CM | POA: Diagnosis not present

## 2022-08-06 DIAGNOSIS — Z23 Encounter for immunization: Secondary | ICD-10-CM | POA: Diagnosis not present

## 2022-08-06 DIAGNOSIS — K219 Gastro-esophageal reflux disease without esophagitis: Secondary | ICD-10-CM | POA: Diagnosis not present

## 2022-08-06 DIAGNOSIS — F17210 Nicotine dependence, cigarettes, uncomplicated: Secondary | ICD-10-CM | POA: Diagnosis not present

## 2022-08-06 DIAGNOSIS — R569 Unspecified convulsions: Secondary | ICD-10-CM | POA: Diagnosis not present

## 2022-08-06 DIAGNOSIS — F17200 Nicotine dependence, unspecified, uncomplicated: Secondary | ICD-10-CM | POA: Diagnosis not present

## 2022-08-06 DIAGNOSIS — J449 Chronic obstructive pulmonary disease, unspecified: Secondary | ICD-10-CM | POA: Diagnosis not present

## 2022-09-05 DIAGNOSIS — J449 Chronic obstructive pulmonary disease, unspecified: Secondary | ICD-10-CM | POA: Diagnosis not present

## 2022-09-05 DIAGNOSIS — R569 Unspecified convulsions: Secondary | ICD-10-CM | POA: Diagnosis not present

## 2022-09-17 DIAGNOSIS — M25561 Pain in right knee: Secondary | ICD-10-CM | POA: Diagnosis not present

## 2022-09-17 DIAGNOSIS — Z79899 Other long term (current) drug therapy: Secondary | ICD-10-CM | POA: Diagnosis not present

## 2022-09-17 DIAGNOSIS — G4459 Other complicated headache syndrome: Secondary | ICD-10-CM | POA: Diagnosis not present

## 2022-09-17 DIAGNOSIS — R251 Tremor, unspecified: Secondary | ICD-10-CM | POA: Diagnosis not present

## 2022-09-17 DIAGNOSIS — M25519 Pain in unspecified shoulder: Secondary | ICD-10-CM | POA: Diagnosis not present

## 2022-10-06 DIAGNOSIS — R569 Unspecified convulsions: Secondary | ICD-10-CM | POA: Diagnosis not present

## 2022-10-06 DIAGNOSIS — J449 Chronic obstructive pulmonary disease, unspecified: Secondary | ICD-10-CM | POA: Diagnosis not present

## 2022-10-22 ENCOUNTER — Ambulatory Visit (INDEPENDENT_AMBULATORY_CARE_PROVIDER_SITE_OTHER): Payer: Medicare Other | Admitting: Neurology

## 2022-10-22 ENCOUNTER — Encounter: Payer: Self-pay | Admitting: Neurology

## 2022-10-22 VITALS — Ht 70.0 in | Wt 191.0 lb

## 2022-10-22 DIAGNOSIS — Z9689 Presence of other specified functional implants: Secondary | ICD-10-CM | POA: Diagnosis not present

## 2022-10-22 DIAGNOSIS — G40019 Localization-related (focal) (partial) idiopathic epilepsy and epileptic syndromes with seizures of localized onset, intractable, without status epilepticus: Secondary | ICD-10-CM

## 2022-10-22 DIAGNOSIS — Z5181 Encounter for therapeutic drug level monitoring: Secondary | ICD-10-CM | POA: Diagnosis not present

## 2022-10-22 MED ORDER — CLOBAZAM 10 MG PO TABS: 10.0000 mg | ORAL_TABLET | Freq: Every evening | ORAL | 5 refills | Status: DC

## 2022-10-22 NOTE — Progress Notes (Signed)
[facility]  PATIENT: Jeffrey Wilcox DOB: 1966-05-31  REQUESTING CLINICIAN: Beryle Beams, MD HISTORY FROM: Patient and chart review  REASON FOR VISIT: Establish care for his seizure disorder    HISTORICAL  CHIEF COMPLAINT:  Chief Complaint  Patient presents with   New Patient (Initial Visit)    Rm 12 NP Paper TOC referral for Seizures. VNS/[facility] Last sz 2-3 weeks ago,     HISTORY OF PRESENT ILLNESS:  This is a 56 year old gentleman past medical history of seizure disorder, on Dilantin and lamotrigine, chronic pain, anxiety who is presenting to establish care for his seizure disorder.  He reports seizure started 20 years ago, he has 2 type of seizure, generalized convulsion and staring spells.  Currently he is on lamotrigine 300 mg twice daily and phenytoin 300 mg twice daily and still having seizures about once to twice a month his last seizure was 2 weeks ago.  With his epilepsy has tried multiple medication in the past including Tegretol, Depakote, phenobarbital, and currently has a VNS.  Handedness: Right hand  Onset: 20 years ago  Seizure Type: Staring spells, generalized convulsion   Current frequency: 1 to 2 a month, last seizure 2 weeks ago   Any injuries from seizures: Broken bone, head injury   Seizure risk factors: Great uncle   Previous ASMs: Tegretol, Depakote, Phenobarb  Currenty ASMs: Lamotrigine 300 mg BID , Dilantin 300 mg BID   ASMs side effects: Denies   Brain Images: CT head 2014: mild atrophy   Previous EEGs: none available for review    OTHER MEDICAL CONDITIONS: Epilepsy intractable, chronic pain, anxiety  REVIEW OF SYSTEMS: Full 14 system review of systems performed and negative with exception of: As noted in the HPI   ALLERGIES: Allergies  Allergen Reactions   Other Anaphylaxis and Rash    [place], roses    Penicillins Other (See Comments)    Hallucination    HOME  MEDICATIONS: Outpatient Medications Prior to Visit  Medication Sig Dispense Refill   albuterol (PROVENTIL HFA;VENTOLIN HFA) 108 (90 BASE) MCG/ACT inhaler Inhale 1-2 puffs into the lungs every 6 (six) hours as needed for wheezing or shortness of breath.     ALPRAZolam (XANAX) 1 MG tablet Take 1 mg by mouth 4 (four) times daily as needed. For anxiety/sleep     cholecalciferol (VITAMIN D3) 25 MCG (1000 UNIT) tablet Take 1,000 Units by mouth daily.     cyclobenzaprine (FLEXERIL) 10 MG tablet Take 10 mg by mouth 2 (two) times a day. Muscle Spasms.     diphenhydrAMINE (BENADRYL) 25 mg capsule Take 25 mg by mouth daily as needed for allergies.     DULoxetine (CYMBALTA) 30 MG capsule Take 60 mg by mouth at bedtime.     escitalopram (LEXAPRO) 20 MG tablet Take 20 mg by mouth 2 (two) times a day.     LamoTRIgine 300 MG TB24 24 hour tablet Take 300 mg by mouth 2 (two) times a day.     omeprazole (PRILOSEC) 20 MG capsule Take 20 mg by mouth daily.     phenytoin (DILANTIN) 300 MG ER capsule Take 300 mg by mouth 2 (two) times daily.      rizatriptan (MAXALT-MLT) 10 MG disintegrating tablet Take 10 mg by mouth as needed for migraine. May repeat in 2 hours if needed     simvastatin (ZOCOR) 20 MG tablet Take 20 mg by mouth at bedtime.      tiotropium (SPIRIVA) 18 MCG inhalation  capsule Place 18 mcg into inhaler and inhale 2 (two) times a day.      No facility-administered medications prior to visit.    PAST MEDICAL HISTORY: Past Medical History:  Diagnosis Date   Anxiety    takes Xanax daily as needed   Arthritis, rheumatoid (HCC)    Chronic back pain    DDD   Degenerative disc disease    Degenerative disc disease    Dizziness    r/t seizures per pt   Enlarged prostate    takes Flomax daily   History of bronchitis many yrs ago   History of colon polyps    benign   History of migraine    last one a month ago-takes Maxalt if needed   Hyperlipidemia    takes Simvastatin nightly   Joint pain     Joint swelling    Muscle spasm    takes Flexeril daily as needed   Osteoarthritis    Pneumonia at age 66   "walking"   PTSD (post-traumatic stress disorder)    takes Lexapro daily   Seizures (HCC)    takes Dilantin and Lamictal daily;last seizure was 2 months ago   Severe depression (HCC)    takes Brintellix daily   Shortness of breath dyspnea    with exertion;has Spiriva and Albuterol if needed   Slow urinary stream     PAST SURGICAL HISTORY: Past Surgical History:  Procedure Laterality Date   COLONOSCOPY     INGUINAL HERNIA REPAIR  01/06/2012   Procedure: HERNIA REPAIR INGUINAL ADULT;  Surgeon: Dalia Heading, MD;  Location: [facility];  Service: General;  Laterality: Right;   MULTIPLE TOOTH EXTRACTIONS     TONSILLECTOMY     VAGUS NERVE STIMULATOR INSERTION N/A 07/26/2015   Procedure: VAGAL NERVE STIMULATOR PLACEMENT;  Surgeon: Lisbeth Renshaw, MD;  Location: [facility];  Service: Neurosurgery;  Laterality: N/A;  vagal nerve stimulator placement   VAGUS NERVE STIMULATOR INSERTION Left 2018   VAGUS NERVE STIMULATOR INSERTION N/A 07/25/2021   Procedure: VAGAL NERVE STIMULATOR BATTERY CHANGE;  Surgeon: Lisbeth Renshaw, MD;  Location: [facility];  Service: Neurosurgery;  Laterality: N/A;    FAMILY HISTORY: Family History  Adopted: Yes    SOCIAL HISTORY: Social History   Socioeconomic History   Marital status: Divorced    Spouse name: Not on file   Number of children: Not on file   Years of education: Not on file   Highest education level: Not on file  Occupational History   Not on file  Tobacco Use   Smoking status: Every Day    Packs/day: 0.50    Years: 33.00    Total pack years: 16.50    Types: Cigarettes   Smokeless tobacco: Former  Building services engineer Use: Never used  Substance and Sexual Activity   Alcohol use: No    Comment: recovering alcoholic for 12 years   Drug use: Yes    Types: Marijuana    Comment: last time was Easter Sunday   Sexual activity: Not  Currently  Other Topics Concern   Not on file  Social History Narrative   Not on file   Social Determinants of Health   Financial Resource Strain: Not on file  Food Insecurity: Not on file  Transportation Needs: Not on file  Physical Activity: Not on file  Stress: Not on file  Social Connections: Not on file  Intimate Partner Violence: Not on file    PHYSICAL EXAM  GENERAL EXAM/CONSTITUTIONAL: Vitals:  Vitals:   10/22/22 0832  Weight: 191 lb (86.6 kg)  Height: 5\' 10"  (1.778 m)   Body mass index is 27.41 kg/m. Wt Readings from Last 3 Encounters:  10/22/22 191 lb (86.6 kg)  07/25/21 191 lb 8 oz (86.9 kg)  07/23/21 198 lb (89.8 kg)   Patient is in no distress; well developed, nourished and groomed; neck is supple  EYES: Visual fields full to confrontation, Extraocular movements intacts,  No results found.  MUSCULOSKELETAL: Gait, strength, tone, movements noted in Neurologic exam below  NEUROLOGIC: MENTAL STATUS:      No data to display         awake, alert, oriented to person, place and time recent and remote memory intact normal attention and concentration language fluent, comprehension intact, naming intact fund of knowledge appropriate  CRANIAL NERVE:  2nd, 3rd, 4th, 6th - Visual fields full to confrontation, extraocular muscles intact, no nystagmus 5th - facial sensation symmetric 7th - facial strength symmetric 8th - hearing intact 9th - palate elevates symmetrically, uvula midline 11th - shoulder shrug symmetric 12th - tongue protrusion midline  MOTOR:  normal bulk and tone, full strength in the BUE, BLE  SENSORY:  normal and symmetric to light touch  COORDINATION:  finger-nose-finger, fine finger movements normal  REFLEXES:  deep tendon reflexes present and symmetric  GAIT/STATION:  normal   DIAGNOSTIC DATA (LABS, IMAGING, TESTING) - I reviewed patient records, labs, notes, testing and imaging myself where available.  Lab Results   Component Value Date   WBC 8.1 07/23/2021   HGB 17.0 07/23/2021   HCT 51.9 07/23/2021   MCV 94.7 07/23/2021   PLT 239 07/23/2021      Component Value Date/Time   NA 138 07/23/2021 1036   K 4.6 07/23/2021 1036   CL 102 07/23/2021 1036   CO2 29 07/23/2021 1036   GLUCOSE 96 07/23/2021 1036   BUN 11 07/23/2021 1036   CREATININE 0.87 07/23/2021 1036   CALCIUM 9.5 07/23/2021 1036   PROT 7.0 03/24/2012 1130   ALBUMIN 3.9 03/24/2012 1130   AST 16 03/24/2012 1130   ALT 17 03/24/2012 1130   ALKPHOS 164 (H) 03/24/2012 1130   BILITOT 0.4 03/24/2012 1130   GFRNONAA >60 07/23/2021 1036   GFRAA >60 07/17/2015 0948   No results found for: "CHOL", "HDL", "LDLCALC", "LDLDIRECT", "TRIG" No results found for: "HGBA1C" No results found for: "VITAMINB12" No results found for: "TSH"    ASSESSMENT AND PLAN  56 y.o. year old male  with history of epilepsy, intractable status post VNS therapy, chronic pain, anxiety who is presenting to establish care.  Patient was well managed previously by Dr. 53 but due to his retirement, he needs a new epileptologist.  He still however has breakthrough seizures 1 or twice a month, stated last seizure was 2 weeks ago. He has staring spells and generalized convulsion. I will continue him on his current medications, lamotrigine 300 mg twice daily and phenytoin 300 mg twice daily, made some changes VNS and add clobazam 10 mg at night.  Advised patient to continue documenting all the seizures and I will see him in 3 months for follow-up.  He voices understanding.    1. Partial idiopathic epilepsy with seizures of localized onset, intractable, without status epilepticus (HCC)   2. Therapeutic drug monitoring   3. S/P placement of VNS (vagus nerve stimulation) device     Patient Instructions  Continue with lamotrigine 300 mg twice daily Continue phenytoin 300  mg twice daily Will check levels today with CMP Will add clobazam 10 mg nightly VNS interrogated  change made, patient tolerated procedure very well Follow-up in 3 months or sooner if worse    Per [facility] statutes, patients with seizures are not allowed to drive until they have been seizure-free for six months.  Other recommendations include using caution when using heavy equipment or power tools. Avoid working on ladders or at heights. Take showers instead of baths.  Do not swim alone.  Ensure the water temperature is not too high on the home water heater. Do not go swimming alone. Do not lock yourself in a room alone (i.e. bathroom). When caring for infants or small children, sit down when holding, feeding, or changing them to minimize risk of injury to the child in the event you have a seizure. Maintain good sleep hygiene. Avoid alcohol.  Also recommend adequate sleep, hydration, good diet and minimize stress.   During the Seizure  - First, ensure adequate ventilation and place patients on the floor on their left side  Loosen clothing around the neck and ensure the airway is patent. If the patient is clenching the teeth, do not force the mouth open with any object as this can cause severe damage - Remove all items from the surrounding that can be hazardous. The patient may be oblivious to what's happening and may not even know what he or she is doing. If the patient is confused and wandering, either gently guide him/her away and block access to outside areas - Reassure the individual and be comforting - Call 911. In most cases, the seizure ends before EMS arrives. However, there are cases when seizures may last over 3 to 5 minutes. Or the individual may have developed breathing difficulties or severe injuries. If a pregnant patient or a person with diabetes develops a seizure, it is prudent to call an ambulance. - Finally, if the patient does not regain full consciousness, then call EMS. Most patients will remain confused for about 45 to 90 minutes after a seizure, so you must use  judgment in calling for help. - Avoid restraints but make sure the patient is in a bed with padded side rails - Place the individual in a lateral position with the neck slightly flexed; this will help the saliva drain from the mouth and prevent the tongue from falling backward - Remove all nearby furniture and other hazards from the area - Provide verbal assurance as the individual is regaining consciousness - Provide the patient with privacy if possible - Call for help and start treatment as ordered by the caregiver   After the Seizure (Postictal Stage)  After a seizure, most patients experience confusion, fatigue, muscle pain and/or a headache. Thus, one should permit the individual to sleep. For the next few days, reassurance is essential. Being calm and helping reorient the person is also of importance.  Most seizures are painless and end spontaneously. Seizures are not harmful to others but can lead to complications such as stress on the lungs, brain and the heart. Individuals with prior lung problems may develop labored breathing and respiratory distress.     Orders Placed This Encounter  Procedures   Lamotrigine level   Phenytoin Level, Total   CMP    Meds ordered this encounter  Medications   cloBAZam (ONFI) 10 MG tablet    Sig: Take 1 tablet (10 mg total) by mouth at bedtime.    Dispense:  30 tablet  Refill:  5    Return in about 3 months (around 01/21/2023).    Windell Norfolk, MD 10/22/2022, 10:25 AM  [facility] [address], [address] [place], [place] [address] [phone]

## 2022-10-22 NOTE — Patient Instructions (Addendum)
Continue with lamotrigine 300 mg twice daily Continue phenytoin 300 mg twice daily Will check levels today with CMP Will add clobazam 10 mg nightly VNS interrogated change made, patient tolerated procedure very well Follow-up in 3 months or sooner if worse

## 2022-10-23 LAB — COMPREHENSIVE METABOLIC PANEL
ALT: 21 IU/L (ref 0–44)
AST: 18 IU/L (ref 0–40)
Albumin/Globulin Ratio: 1.5 (ref 1.2–2.2)
Albumin: 4.6 g/dL (ref 3.8–4.9)
Alkaline Phosphatase: 212 IU/L — ABNORMAL HIGH (ref 44–121)
BUN/Creatinine Ratio: 14 (ref 9–20)
BUN: 11 mg/dL (ref 6–24)
Bilirubin Total: <0.2 mg/dL (ref 0.0–1.2)
CO2: 25 mmol/L (ref 20–29)
Calcium: 10.1 mg/dL (ref 8.7–10.2)
Chloride: 103 mmol/L (ref 96–106)
Creatinine, Ser: 0.79 mg/dL (ref 0.76–1.27)
Globulin, Total: 3 g/dL (ref 1.5–4.5)
Glucose: 107 mg/dL — ABNORMAL HIGH (ref 70–99)
Potassium: 4.7 mmol/L (ref 3.5–5.2)
Sodium: 143 mmol/L (ref 134–144)
Total Protein: 7.6 g/dL (ref 6.0–8.5)
eGFR: 105 mL/min/{1.73_m2} (ref >59)

## 2022-10-23 LAB — LAMOTRIGINE LEVEL: Lamotrigine Lvl: 4.1 ug/mL (ref 2.0–20.0)

## 2022-10-23 LAB — PHENYTOIN LEVEL, TOTAL: Phenytoin (Dilantin), Serum: 4.3 ug/mL — ABNORMAL LOW (ref 10.0–20.0)

## 2022-10-28 ENCOUNTER — Telehealth: Payer: Self-pay

## 2022-10-28 NOTE — Telephone Encounter (Signed)
A PA for Clobazam 10 MG tablets has been submitted on CMM. Key #: [id number]

## 2022-11-03 ENCOUNTER — Emergency Department (HOSPITAL_COMMUNITY)
Admission: EM | Admit: 2022-11-03 | Discharge: 2022-11-03 | Disposition: A | Discharge status: Home / Self Care | Payer: Medicare Other | Attending: Emergency Medicine | Admitting: Emergency Medicine

## 2022-11-03 ENCOUNTER — Emergency Department (HOSPITAL_COMMUNITY): Payer: Medicare Other

## 2022-11-03 ENCOUNTER — Encounter (HOSPITAL_COMMUNITY): Payer: Self-pay | Admitting: *Deleted

## 2022-11-03 ENCOUNTER — Other Ambulatory Visit: Payer: Self-pay

## 2022-11-03 DIAGNOSIS — M549 Dorsalgia, unspecified: Secondary | ICD-10-CM | POA: Diagnosis not present

## 2022-11-03 DIAGNOSIS — M5459 Other low back pain: Secondary | ICD-10-CM | POA: Diagnosis not present

## 2022-11-03 DIAGNOSIS — Z79899 Other long term (current) drug therapy: Secondary | ICD-10-CM | POA: Insufficient documentation

## 2022-11-03 DIAGNOSIS — D3501 Benign neoplasm of right adrenal gland: Secondary | ICD-10-CM | POA: Diagnosis not present

## 2022-11-03 DIAGNOSIS — R109 Unspecified abdominal pain: Secondary | ICD-10-CM | POA: Diagnosis not present

## 2022-11-03 DIAGNOSIS — M545 Low back pain, unspecified: Secondary | ICD-10-CM | POA: Diagnosis not present

## 2022-11-03 LAB — URINALYSIS, ROUTINE W REFLEX MICROSCOPIC
Bilirubin Urine: NEGATIVE
Glucose, UA: NEGATIVE mg/dL
Hgb urine dipstick: NEGATIVE
Ketones, ur: 5 mg/dL — AB
Leukocytes,Ua: NEGATIVE
Nitrite: NEGATIVE
Protein, ur: NEGATIVE mg/dL
Specific Gravity, Urine: 1.018 (ref 1.005–1.030)
pH: 5 (ref 5.0–8.0)

## 2022-11-03 MED ORDER — HYDROCODONE-ACETAMINOPHEN 5-325 MG PO TABS: 1.0000 | ORAL_TABLET | ORAL | 0 refills | Status: DC | PRN

## 2022-11-03 MED ORDER — HYDROCODONE-ACETAMINOPHEN 5-325 MG PO TABS
2.0000 | ORAL_TABLET | Freq: Once | ORAL | Status: AC
  Administered 2022-11-03: 2 via ORAL
  Filled 2022-11-03: qty 2

## 2022-11-03 MED ORDER — DICLOFENAC SODIUM 75 MG PO TBEC: 75.0000 mg | DELAYED_RELEASE_TABLET | Freq: Two times a day (BID) | ORAL | 0 refills | Status: DC

## 2022-11-03 NOTE — ED Triage Notes (Signed)
Pt c/o left side back pain that radiates down his buttocks and into his leg  Pt states he fell to the floor last night 3 times due to his leg giving out on him

## 2022-11-03 NOTE — ED Notes (Signed)
Pt ambulated to bathroom to void without assistance. No acute distress. C/O continuing pain to his left side.

## 2022-11-03 NOTE — ED Notes (Signed)
Patient transported to CT 

## 2022-11-03 NOTE — ED Notes (Signed)
Pt ambulated to restroom. 

## 2022-11-04 NOTE — ED Provider Notes (Signed)
[facility] EMERGENCY DEPARTMENT Provider Note   CSN: [id number] Arrival date & time: 11/03/22  0746     History  Chief Complaint  Patient presents with   Back Pain    Jeffrey Wilcox is a 56 y.o. male.  Patient complains of pain in his left low back.  Patient reports he thinks that he has a kidney stone.  The history is provided by the patient. No language interpreter was used.  Back Pain Location:  Generalized Quality:  Aching Duration:  1 day Progression:  Worsening Chronicity:  New Relieved by:  Nothing Associated symptoms: no abdominal pain and no fever        Home Medications Prior to Admission medications   Medication Sig Start Date End Date Taking? Authorizing Provider  diclofenac (VOLTAREN) 75 MG EC tablet Take 1 tablet (75 mg total) by mouth 2 (two) times daily. 11/03/22  Yes Elson Areas, PA-C  HYDROcodone-acetaminophen (NORCO/VICODIN) 5-325 MG tablet Take 1 tablet by mouth every 4 (four) hours as needed for moderate pain. 11/03/22 11/03/23 Yes Elson Areas, PA-C  albuterol (PROVENTIL HFA;VENTOLIN HFA) 108 (90 BASE) MCG/ACT inhaler Inhale 1-2 puffs into the lungs every 6 (six) hours as needed for wheezing or shortness of breath.    [provider]  ALPRAZolam Prudy Feeler) 1 MG tablet Take 1 mg by mouth 4 (four) times daily as needed. For anxiety/sleep 09/22/13   [provider]  cholecalciferol (VITAMIN D3) 25 MCG (1000 UNIT) tablet Take 1,000 Units by mouth daily.    [provider]  cloBAZam (ONFI) 10 MG tablet Take 1 tablet (10 mg total) by mouth at bedtime. 10/22/22 04/20/23  Windell Norfolk, MD  cyclobenzaprine (FLEXERIL) 10 MG tablet Take 10 mg by mouth 2 (two) times a day. Muscle Spasms.    [provider]  diphenhydrAMINE (BENADRYL) 25 mg capsule Take 25 mg by mouth daily as needed for allergies.    [provider]  DULoxetine (CYMBALTA) 30 MG capsule Take 60 mg by mouth at bedtime. 06/25/21   [provider]  escitalopram (LEXAPRO) 20 MG tablet Take 20 mg by mouth 2 (two) times a day. 03/31/19   [provider]  LamoTRIgine 300 MG TB24 24 hour tablet Take 300 mg by mouth 2 (two) times a day. 01/02/19   [provider]  omeprazole (PRILOSEC) 20 MG capsule Take 20 mg by mouth daily. 04/02/19   [provider]  phenytoin (DILANTIN) 300 MG ER capsule Take 300 mg by mouth 2 (two) times daily.     [provider]  rizatriptan (MAXALT-MLT) 10 MG disintegrating tablet Take 10 mg by mouth as needed for migraine. May repeat in 2 hours if needed    [provider]  simvastatin (ZOCOR) 20 MG tablet Take 20 mg by mouth at bedtime.     [provider]  tiotropium (SPIRIVA) 18 MCG inhalation capsule Place 18 mcg into inhaler and inhale 2 (two) times a day.     [provider]      Allergies    Other and Penicillins    Review of Systems   Review of Systems  Constitutional:  Negative for fever.  Gastrointestinal:  Negative for abdominal pain.  Musculoskeletal:  Positive for back pain.  All other systems reviewed and are negative.   Physical Exam Updated Vital Signs BP 125/79   Pulse 75   Temp 98.2 F (36.8 C) (Oral)   Resp 16   Ht 5\' 10"  (1.778 m)  Wt 86.6 kg   SpO2 97%   BMI 27.39 kg/m  Physical Exam Vitals and nursing note reviewed.  Constitutional:      Appearance: He is well-developed.  HENT:     Head: Normocephalic.  Cardiovascular:     Rate and Rhythm: Normal rate.  Pulmonary:     Effort: Pulmonary effort is normal.  Abdominal:     General: There is no distension.  Musculoskeletal:        General: Normal range of motion.     Cervical back: Normal range of motion.  Skin:    General: Skin is warm.  Neurological:     General: No focal deficit present.     Mental Status: He is alert and oriented to person, place, and time.  Psychiatric:        Mood and Affect: Mood normal.     ED Results / Procedures /  Treatments   Labs (all labs ordered are listed, but only abnormal results are displayed) Labs Reviewed  URINALYSIS, ROUTINE W REFLEX MICROSCOPIC - Abnormal; Notable for the following components:      Result Value   Ketones, ur 5 (*)    All other components within normal limits    EKG None  Radiology CT Renal Stone Study  Result Date: 11/03/2022 CLINICAL DATA:  Left-sided flank and back pain. EXAM: CT ABDOMEN AND PELVIS WITHOUT CONTRAST TECHNIQUE: Multidetector CT imaging of the abdomen and pelvis was performed following the standard protocol without IV contrast. RADIATION DOSE REDUCTION: This exam was performed according to the departmental dose-optimization program which includes automated exposure control, adjustment of the mA and/or kV according to patient size and/or use of iterative reconstruction technique. COMPARISON:  None Available. FINDINGS: Lower chest: No acute findings. Hepatobiliary: No mass visualized on this unenhanced exam. Gallbladder is unremarkable. No evidence of biliary ductal dilatation. Pancreas: No mass or inflammatory process visualized on this unenhanced exam. Spleen:  Within normal limits in size. Adrenals/Urinary tract: Bilateral low-attenuation adrenal masses are seen measuring 3.6 cm on the right and 2.6 cm on the left. Both measure -2 Hounsfield units, consistent with benign adrenal adenomas. No evidence of urolithiasis or hydronephrosis. Unremarkable unopacified urinary bladder. Stomach/Bowel: No evidence of obstruction, inflammatory process, or abnormal fluid collections. Vascular/Lymphatic: No pathologically enlarged lymph nodes identified. No evidence of abdominal aortic aneurysm. Reproductive:  No mass or other significant abnormality. Other:  None. Musculoskeletal:  No suspicious bone lesions identified. IMPRESSION: No evidence of urolithiasis, hydronephrosis, or other acute findings. Bilateral benign adrenal adenomas (no followup imaging is recommended) . 1  Electronically Signed   By: Marlaine Hind M.D.   On: 11/03/2022 12:28    Procedures Procedures    Medications Ordered in ED Medications  HYDROcodone-acetaminophen (NORCO/VICODIN) 5-325 MG per tablet 2 tablet (2 tablets Oral Given 11/03/22 1151)    ED Course/ Medical Decision Making/ A&P                           Medical Decision Making Complains of low back pain.  Patient reports pain radiates down his leg as well  Amount and/or Complexity of Data Reviewed Labs: ordered. Decision-making details documented in ED Course.    Details: UA is negative Radiology: ordered and independent interpretation performed. Decision-making details documented in ED Course.    Details: CT renal shows no acute abnormality  Risk Prescription drug management. Risk Details: I suspect patient's pain is musculoskeletal I will try him on Voltaren.  Patient is  given a prescription for hydrocodone for pain he is advised to schedule to see his physician for recheck in 1 week if pain persist.           Final Clinical Impression(s) / ED Diagnoses Final diagnoses:  Acute left-sided low back pain, unspecified whether sciatica present    Rx / DC Orders ED Discharge Orders          Ordered    HYDROcodone-acetaminophen (NORCO/VICODIN) 5-325 MG tablet  Every 4 hours PRN        11/03/22 1331    diclofenac (VOLTAREN) 75 MG EC tablet  2 times daily        11/03/22 1331           An After Visit Summary was printed and given to the patient.    Sidney Ace 11/04/22 2126    Tretha Sciara, MD 11/07/22 [phone]

## 2022-11-05 DIAGNOSIS — R569 Unspecified convulsions: Secondary | ICD-10-CM | POA: Diagnosis not present

## 2022-11-05 DIAGNOSIS — J449 Chronic obstructive pulmonary disease, unspecified: Secondary | ICD-10-CM | POA: Diagnosis not present

## 2022-11-11 ENCOUNTER — Telehealth: Payer: Self-pay

## 2022-11-11 NOTE — Telephone Encounter (Signed)
        Patient  visited [place] on 12/26    Telephone encounter attempt :  2nd  A HIPAA compliant voice message was left requesting a return call.  Instructed patient to call back .    Kaitlyne Friedhoff [facility], [place], Care Management  [phone] [address], [place], [place] [address] Phone: [phone] Email: [email or web address]       

## 2022-11-12 ENCOUNTER — Telehealth: Payer: Self-pay

## 2022-11-12 NOTE — Telephone Encounter (Signed)
        Patient  visited [place] on 12/26    Telephone encounter attempt :  2nd  A HIPAA compliant voice message was left requesting a return call.  Instructed patient to call back .    Jackob Crookston [facility] Guide, [place], Care Management  [phone] [address], [place], [place] [address] Phone: [phone] Email: [email or web address]       

## 2022-11-19 ENCOUNTER — Ambulatory Visit: Payer: Medicare Other | Admitting: Family Medicine

## 2022-12-06 DIAGNOSIS — E785 Hyperlipidemia, unspecified: Secondary | ICD-10-CM | POA: Diagnosis not present

## 2022-12-06 DIAGNOSIS — J449 Chronic obstructive pulmonary disease, unspecified: Secondary | ICD-10-CM | POA: Diagnosis not present

## 2023-01-03 ENCOUNTER — Emergency Department (HOSPITAL_COMMUNITY): Payer: 59

## 2023-01-03 ENCOUNTER — Emergency Department (HOSPITAL_COMMUNITY)
Admission: EM | Admit: 2023-01-03 | Discharge: 2023-01-03 | Disposition: A | Discharge status: Home / Self Care | Payer: 59 | Attending: Emergency Medicine | Admitting: Emergency Medicine

## 2023-01-03 DIAGNOSIS — Z743 Need for continuous supervision: Secondary | ICD-10-CM | POA: Diagnosis not present

## 2023-01-03 DIAGNOSIS — W01198A Fall on same level from slipping, tripping and stumbling with subsequent striking against other object, initial encounter: Secondary | ICD-10-CM | POA: Insufficient documentation

## 2023-01-03 DIAGNOSIS — Z79899 Other long term (current) drug therapy: Secondary | ICD-10-CM | POA: Diagnosis not present

## 2023-01-03 DIAGNOSIS — I10 Essential (primary) hypertension: Secondary | ICD-10-CM | POA: Diagnosis not present

## 2023-01-03 DIAGNOSIS — Y9301 Activity, walking, marching and hiking: Secondary | ICD-10-CM | POA: Diagnosis not present

## 2023-01-03 DIAGNOSIS — K068 Other specified disorders of gingiva and edentulous alveolar ridge: Secondary | ICD-10-CM | POA: Diagnosis not present

## 2023-01-03 DIAGNOSIS — S80919A Unspecified superficial injury of unspecified knee, initial encounter: Secondary | ICD-10-CM | POA: Diagnosis not present

## 2023-01-03 DIAGNOSIS — M25561 Pain in right knee: Secondary | ICD-10-CM | POA: Insufficient documentation

## 2023-01-03 DIAGNOSIS — W19XXXA Unspecified fall, initial encounter: Secondary | ICD-10-CM

## 2023-01-03 DIAGNOSIS — R569 Unspecified convulsions: Secondary | ICD-10-CM | POA: Diagnosis not present

## 2023-01-03 DIAGNOSIS — S8991XA Unspecified injury of right lower leg, initial encounter: Secondary | ICD-10-CM | POA: Diagnosis not present

## 2023-01-03 DIAGNOSIS — J019 Acute sinusitis, unspecified: Secondary | ICD-10-CM | POA: Diagnosis not present

## 2023-01-03 DIAGNOSIS — R55 Syncope and collapse: Secondary | ICD-10-CM | POA: Diagnosis not present

## 2023-01-03 LAB — CBC WITH DIFFERENTIAL/PLATELET
Abs Immature Granulocytes: 0.02 10*3/uL (ref 0.00–0.07)
Basophils Absolute: 0 10*3/uL (ref 0.0–0.1)
Basophils Relative: 0 %
Eosinophils Absolute: 0.2 10*3/uL (ref 0.0–0.5)
Eosinophils Relative: 2 %
HCT: 51.7 % (ref 39.0–52.0)
Hemoglobin: 17.2 g/dL — ABNORMAL HIGH (ref 13.0–17.0)
Immature Granulocytes: 0 %
Lymphocytes Relative: 24 %
Lymphs Abs: 1.7 10*3/uL (ref 0.7–4.0)
MCH: 30.8 pg (ref 26.0–34.0)
MCHC: 33.3 g/dL (ref 30.0–36.0)
MCV: 92.7 fL (ref 80.0–100.0)
Monocytes Absolute: 0.6 10*3/uL (ref 0.1–1.0)
Monocytes Relative: 8 %
Neutro Abs: 4.8 10*3/uL (ref 1.7–7.7)
Neutrophils Relative %: 66 %
Platelets: 160 10*3/uL (ref 150–400)
RBC: 5.58 MIL/uL (ref 4.22–5.81)
RDW: 13.8 % (ref 11.5–15.5)
WBC: 7.3 10*3/uL (ref 4.0–10.5)
nRBC: 0 % (ref 0.0–0.2)

## 2023-01-03 LAB — BASIC METABOLIC PANEL
Anion gap: 4 — ABNORMAL LOW (ref 5–15)
BUN: 15 mg/dL (ref 6–20)
CO2: 25 mmol/L (ref 22–32)
Calcium: 9.5 mg/dL (ref 8.9–10.3)
Chloride: 107 mmol/L (ref 98–111)
Creatinine, Ser: 0.83 mg/dL (ref 0.61–1.24)
GFR, Estimated: >60 mL/min (ref >60)
Glucose, Bld: 99 mg/dL (ref 70–99)
Potassium: 4.8 mmol/L (ref 3.5–5.1)
Sodium: 136 mmol/L (ref 135–145)

## 2023-01-03 MED ORDER — AMOXICILLIN-POT CLAVULANATE 875-125 MG PO TABS: 1.0000 | ORAL_TABLET | Freq: Two times a day (BID) | ORAL | 0 refills | Status: DC

## 2023-01-03 NOTE — ED Provider Notes (Signed)
[facility] Provider Note   CSN: [id number] Arrival date & time: 01/03/23  1103     History  Chief Complaint  Patient presents with   Fall   Seizures    Jeffrey Wilcox is a 57 y.o. male.   Fall  Seizures    Patient presents to the emergency department due to fall.  Patient was walking with his dog, his right foot got stuck in a hole in the woods and he twisted it causing pain to the right knee.  He has been able to ambulate since then but it is associated with significant pain.  States in route with EMS he did have 2 reported seizure-like activity with his right upper extremity having focal twitches, he was conscious during this according to EMS but patient does not recall.  He states he had 1 breakthrough seizure a week ago but otherwise he has not had any seizures in last 6 or 7 months.  He takes Dilantin, no missed doses.  Denies any fevers, chills, abdominal pain, nausea, vomiting.  Uses marijuana but no other recreational drug use.  Home Medications Prior to Admission medications   Medication Sig Start Date End Date Taking? Authorizing Provider  amoxicillin-clavulanate (AUGMENTIN) 875-125 MG tablet Take 1 tablet by mouth every 12 (twelve) hours. 01/03/23  Yes Sherrill Raring, PA-C  albuterol (PROVENTIL HFA;VENTOLIN HFA) 108 (90 BASE) MCG/ACT inhaler Inhale 1-2 puffs into the lungs every 6 (six) hours as needed for wheezing or shortness of breath.    [provider]  ALPRAZolam Duanne Moron) 1 MG tablet Take 1 mg by mouth 4 (four) times daily as needed. For anxiety/sleep 09/22/13   [provider]  cholecalciferol (VITAMIN D3) 25 MCG (1000 UNIT) tablet Take 1,000 Units by mouth daily.    [provider]  cloBAZam (ONFI) 10 MG tablet Take 1 tablet (10 mg total) by mouth at bedtime. 10/22/22 04/20/23  Alric Ran, MD  cyclobenzaprine (FLEXERIL) 10 MG tablet Take 10 mg by mouth 2 (two) times a day. Muscle Spasms.     [provider]  diclofenac (VOLTAREN) 75 MG EC tablet Take 1 tablet (75 mg total) by mouth 2 (two) times daily. 11/03/22   Fransico Meadow, PA-C  diphenhydrAMINE (BENADRYL) 25 mg capsule Take 25 mg by mouth daily as needed for allergies.    [provider]  DULoxetine (CYMBALTA) 30 MG capsule Take 60 mg by mouth at bedtime. 06/25/21   [provider]  escitalopram (LEXAPRO) 20 MG tablet Take 20 mg by mouth 2 (two) times a day. 03/31/19   [provider]  HYDROcodone-acetaminophen (NORCO/VICODIN) 5-325 MG tablet Take 1 tablet by mouth every 4 (four) hours as needed for moderate pain. 11/03/22 11/03/23  Fransico Meadow, PA-C  LamoTRIgine 300 MG TB24 24 hour tablet Take 300 mg by mouth 2 (two) times a day. 01/02/19   [provider]  omeprazole (PRILOSEC) 20 MG capsule Take 20 mg by mouth daily. 04/02/19   [provider]  phenytoin (DILANTIN) 300 MG ER capsule Take 300 mg by mouth 2 (two) times daily.     [provider]  rizatriptan (MAXALT-MLT) 10 MG disintegrating tablet Take 10 mg by mouth as needed for migraine. May repeat in 2 hours if needed    [provider]  simvastatin (ZOCOR) 20 MG tablet Take 20 mg by mouth at bedtime.     [provider]  tiotropium (SPIRIVA) 18 MCG inhalation capsule Place 18  mcg into inhaler and inhale 2 (two) times a day.     [provider]      Allergies    Other and Penicillins    Review of Systems   Review of Systems  Neurological:  Positive for seizures.    Physical Exam Updated Vital Signs BP (!) 137/91   Pulse 79   Temp 98 F (36.7 C) (Oral)   Resp 15   SpO2 96%  Physical Exam Vitals and nursing note reviewed. Exam conducted with a chaperone present.  Constitutional:      Appearance: Normal appearance.  HENT:     Head: Normocephalic and atraumatic.     Mouth/Throat:     Comments: Edentulous  Eyes:     General: No scleral icterus.       Right eye: No  discharge.        Left eye: No discharge.     Extraocular Movements: Extraocular movements intact.     Pupils: Pupils are equal, round, and reactive to light.  Cardiovascular:     Rate and Rhythm: Normal rate and regular rhythm.     Pulses: Normal pulses.     Heart sounds: Normal heart sounds. No murmur heard.    No friction rub. No gallop.  Pulmonary:     Effort: Pulmonary effort is normal. No respiratory distress.     Breath sounds: Normal breath sounds.  Abdominal:     General: Abdomen is flat. Bowel sounds are normal. There is no distension.     Palpations: Abdomen is soft.     Tenderness: There is no abdominal tenderness.  Musculoskeletal:        General: Tenderness present.     Comments: Tenderness over right patella, no laxity.  Tolerates passive ROM and patient is able to flex and extend  Skin:    General: Skin is warm and dry.     Coloration: Skin is not jaundiced.  Neurological:     Mental Status: He is alert. Mental status is at baseline.     Coordination: Coordination normal.     Comments: Cranial nerves II through XII are grossly intact, upper and lower extremity strength equal and symmetric bilaterally.     ED Results / Procedures / Treatments   Labs (all labs ordered are listed, but only abnormal results are displayed) Labs Reviewed  BASIC METABOLIC PANEL - Abnormal; Notable for the following components:      Result Value   Anion gap 4 (*)    All other components within normal limits  CBC WITH DIFFERENTIAL/PLATELET - Abnormal; Notable for the following components:   Hemoglobin 17.2 (*)    All other components within normal limits  PHENYTOIN LEVEL, FREE AND TOTAL  CBG MONITORING, ED    EKG None  Radiology CT Head Wo Contrast  Result Date: 01/03/2023 CLINICAL DATA:  Seizure EXAM: CT HEAD WITHOUT CONTRAST TECHNIQUE: Contiguous axial images were obtained from the base of the skull through the vertex without intravenous contrast. RADIATION DOSE REDUCTION:  This exam was performed according to the departmental dose-optimization program which includes automated exposure control, adjustment of the mA and/or kV according to patient size and/or use of iterative reconstruction technique. COMPARISON:  12/19/12 CT Head FINDINGS: Brain: No evidence of acute infarction, hemorrhage, hydrocephalus, extra-axial collection or mass lesion/mass effect. Vascular: No hyperdense vessel or unexpected calcification. Skull: Normal. Negative for fracture or focal lesion. Sinuses/Orbits: No middle ear or mastoid effusion. Paranasal sinuses are notable for frothy secretions in the right maxillary  sinus, which can seen in the setting acute sinusitis. Orbits are unremarkable. Other: 1.9 x 1.4 cm hypodense subcutaneous lesion along the inferior auricular soft tissues on the left (series 2, image 8), likely a epidermal inclusion cyst. IMPRESSION: 1. No acute intracranial abnormality.  No seizure focus identified. 2. Frothy secretions in the right maxillary sinus, which can seen in the setting of acute sinusitis. 3. 1.9 cm hypodense subcutaneous lesion along the inferior auricular soft tissues on the left, likely an epidermal inclusion cyst. Correlate with physical exam. Electronically Signed   By: Marin Roberts M.D.   On: 01/03/2023 12:34   DG Knee Complete 4 Views Right  Result Date: 01/03/2023 CLINICAL DATA:  Fall, injury. EXAM: RIGHT KNEE - COMPLETE 4+ VIEW COMPARISON:  None Available. FINDINGS: Osseous alignment is within normal limits. No fracture line or displaced fracture fragment is seen. No significant degenerative change. Superficial soft tissues about the RIGHT knee are unremarkable. IMPRESSION: Negative. Electronically Signed   By: Franki Cabot M.D.   On: 01/03/2023 12:02    Procedures Procedures    Medications Ordered in ED Medications - No data to display  ED Course/ Medical Decision Making/ A&P                             Medical Decision Making Amount and/or  Complexity of Data Reviewed Labs: ordered. Radiology: ordered.  Risk Prescription drug management.   This is a very pleasant 57 year old male presenting to the emergency department due to right knee pain and possible seizure.  Differential includes fracture, dislocation, sprain, tendon or ligamental injury, electrolyte derangement, medical noncompliance, underlying infectious etiology causing lowered seizure threshold, alternative etiology.  On exam patient is not postictal, he has no appreciable focal deficits.  He does smoke marijuana but denies any other drug use, endorses nasal congestion but no other viral symptoms and he is afebrile without any SIRS criteria, nonseptic appearing.  Will check labs, CT head, plain film of the knee.  No gross electrolyte derangement or AKI, phenytoin level is currently pending.  He has been no observed seizure-like activity in the ED, question focal seizure per EMS report.  Plain film of the knee is negative for any fracture or dislocation, patient is able to ambulate and bear weight.  CT head shows signs of sinusitis but no other acute findings.  Will cover with Augmentin.  I discussed the workup with the patient, will have him follow-up with his neurologist regarding the phenytoin but for now we will have him continue taking as prescribed.  Meloxicam advised for acute knee inflammation, stable for close outpatient follow-up with strict return precautions.        Final Clinical Impression(s) / ED Diagnoses Final diagnoses:  Fall, initial encounter  Acute sinusitis, recurrence not specified, unspecified location    Rx / DC Orders ED Discharge Orders          Ordered    amoxicillin-clavulanate (AUGMENTIN) 875-125 MG tablet  Every 12 hours        01/03/23 1318              Sherrill Raring, [place] 01/03/23 1746    Milton Ferguson, MD 01/05/23 1528

## 2023-01-03 NOTE — Discharge Instructions (Signed)
Your x-ray today was reassuring, nothing is broken.  You can take Tylenol and Motrin or ibuprofen as needed for pain.  Please follow-up with your neurologist regarding the breakthrough seizures, the light level was checked today so they can reference that.  Continue taking her medicine as it is currently prescribed.  Take Augmentin twice daily for a week, this is for sinus infection.  Return for new or concerning symptoms, breakthrough seizures, fevers etc.

## 2023-01-03 NOTE — ED Triage Notes (Signed)
Patient reports stepping in a hole and  falling afternoon injuring his right knee. No deformity noted. En-route to ED patient had several episodes of seizure activity. Patient has a VNS and is followed by neurology. Patient A&O x 4.

## 2023-01-05 LAB — PHENYTOIN LEVEL, FREE AND TOTAL
Phenytoin, Free: NOT DETECTED ug/mL (ref 1.0–2.0)
Phenytoin, Total: 5.9 ug/mL — ABNORMAL LOW (ref 10.0–20.0)

## 2023-01-07 ENCOUNTER — Encounter: Payer: Self-pay | Admitting: Radiology

## 2023-01-07 DIAGNOSIS — E785 Hyperlipidemia, unspecified: Secondary | ICD-10-CM | POA: Diagnosis not present

## 2023-01-07 DIAGNOSIS — J449 Chronic obstructive pulmonary disease, unspecified: Secondary | ICD-10-CM | POA: Diagnosis not present

## 2023-01-11 ENCOUNTER — Telehealth: Payer: Self-pay

## 2023-01-11 NOTE — Telephone Encounter (Signed)
     Patient  visit on 01/03/2023  at [facility] was for fall, seizures.  Have you been able to follow up with your primary care physician? Patient stated he will follow-up this week.  The patient was or was not able to obtain any needed medicine or equipment. Patient was able to obtain medication.  Are there diet recommendations that you are having difficulty following? No  Patient expresses understanding of discharge instructions and education provided has no other needs at this time. Yes   [facility] Resource Care Guide   [email or web address]  ?? [id number]   Website: [email or web address]

## 2023-02-04 DIAGNOSIS — K219 Gastro-esophageal reflux disease without esophagitis: Secondary | ICD-10-CM | POA: Diagnosis not present

## 2023-02-04 DIAGNOSIS — F17200 Nicotine dependence, unspecified, uncomplicated: Secondary | ICD-10-CM | POA: Diagnosis not present

## 2023-02-04 DIAGNOSIS — J449 Chronic obstructive pulmonary disease, unspecified: Secondary | ICD-10-CM | POA: Diagnosis not present

## 2023-02-04 DIAGNOSIS — F17210 Nicotine dependence, cigarettes, uncomplicated: Secondary | ICD-10-CM | POA: Diagnosis not present

## 2023-02-04 DIAGNOSIS — R569 Unspecified convulsions: Secondary | ICD-10-CM | POA: Diagnosis not present

## 2023-02-04 DIAGNOSIS — Z0001 Encounter for general adult medical examination with abnormal findings: Secondary | ICD-10-CM | POA: Diagnosis not present

## 2023-02-04 DIAGNOSIS — Z1389 Encounter for screening for other disorder: Secondary | ICD-10-CM | POA: Diagnosis not present

## 2023-02-04 DIAGNOSIS — E785 Hyperlipidemia, unspecified: Secondary | ICD-10-CM | POA: Diagnosis not present

## 2023-02-08 ENCOUNTER — Telehealth: Payer: Self-pay | Admitting: Neurology

## 2023-02-08 NOTE — Telephone Encounter (Signed)
Called pt, he will be calling pharmacy. He will call us if there are any issues.

## 2023-02-08 NOTE — Telephone Encounter (Signed)
Pt states pharmacy [facility] [phone]   is waiting on approval from [place] office to give pt refill of  cloBAZam (ONFI) 10 MG tablet which he is in need of.

## 2023-02-15 ENCOUNTER — Ambulatory Visit: Payer: Medicare Other | Admitting: Neurology

## 2023-03-07 DIAGNOSIS — J449 Chronic obstructive pulmonary disease, unspecified: Secondary | ICD-10-CM | POA: Diagnosis not present

## 2023-03-07 DIAGNOSIS — E785 Hyperlipidemia, unspecified: Secondary | ICD-10-CM | POA: Diagnosis not present

## 2023-04-06 DIAGNOSIS — E785 Hyperlipidemia, unspecified: Secondary | ICD-10-CM | POA: Diagnosis not present

## 2023-04-06 DIAGNOSIS — J449 Chronic obstructive pulmonary disease, unspecified: Secondary | ICD-10-CM | POA: Diagnosis not present

## 2023-04-08 ENCOUNTER — Ambulatory Visit: Payer: 59 | Admitting: Family Medicine

## 2023-05-03 ENCOUNTER — Encounter: Payer: Self-pay | Admitting: Family Medicine

## 2023-05-03 ENCOUNTER — Ambulatory Visit (INDEPENDENT_AMBULATORY_CARE_PROVIDER_SITE_OTHER): Payer: 59 | Admitting: Family Medicine

## 2023-05-03 VITALS — BP 139/86 | HR 76 | Ht 72.0 in | Wt 182.1 lb

## 2023-05-03 DIAGNOSIS — Z1211 Encounter for screening for malignant neoplasm of colon: Secondary | ICD-10-CM

## 2023-05-03 DIAGNOSIS — F331 Major depressive disorder, recurrent, moderate: Secondary | ICD-10-CM | POA: Diagnosis not present

## 2023-05-03 DIAGNOSIS — J449 Chronic obstructive pulmonary disease, unspecified: Secondary | ICD-10-CM | POA: Diagnosis not present

## 2023-05-03 DIAGNOSIS — R7301 Impaired fasting glucose: Secondary | ICD-10-CM | POA: Diagnosis not present

## 2023-05-03 DIAGNOSIS — K219 Gastro-esophageal reflux disease without esophagitis: Secondary | ICD-10-CM | POA: Diagnosis not present

## 2023-05-03 DIAGNOSIS — F17200 Nicotine dependence, unspecified, uncomplicated: Secondary | ICD-10-CM | POA: Diagnosis not present

## 2023-05-03 DIAGNOSIS — E038 Other specified hypothyroidism: Secondary | ICD-10-CM | POA: Diagnosis not present

## 2023-05-03 DIAGNOSIS — Z1159 Encounter for screening for other viral diseases: Secondary | ICD-10-CM

## 2023-05-03 DIAGNOSIS — Z114 Encounter for screening for human immunodeficiency virus [HIV]: Secondary | ICD-10-CM

## 2023-05-03 DIAGNOSIS — E559 Vitamin D deficiency, unspecified: Secondary | ICD-10-CM

## 2023-05-03 DIAGNOSIS — E785 Hyperlipidemia, unspecified: Secondary | ICD-10-CM | POA: Insufficient documentation

## 2023-05-03 DIAGNOSIS — E7849 Other hyperlipidemia: Secondary | ICD-10-CM | POA: Diagnosis not present

## 2023-05-03 DIAGNOSIS — M549 Dorsalgia, unspecified: Secondary | ICD-10-CM | POA: Insufficient documentation

## 2023-05-03 NOTE — Assessment & Plan Note (Signed)
He takes Dilantin 300 mg BID He is following up with his neurologist at [facility] neurology No recent reported seizure episode

## 2023-05-03 NOTE — Assessment & Plan Note (Signed)
Stable on Advair  No reports of shortness of breath, chest pain, palpitation, and dyspnea

## 2023-05-03 NOTE — Assessment & Plan Note (Signed)
Last lung cancer screening was 2 years ago He reports smoking since the age of [age] 5 pack of cigarette daily He currently smokes 1 pack of cigarettes daily Smoking cessation encouraged Education as follow up: Smoking is harmful to your health and increases your risk for cancer, COPD, high blood pressure, cataracts, digestive problems, or health problems , such as gum disease, mouth sores, and tooth loss and loss of taste and smell. Smoking irritates your throat and causes coughing.   Referral placed to pulmonary for lung cancer screening

## 2023-05-03 NOTE — Patient Instructions (Addendum)
I appreciate the opportunity to provide care to you today!    Follow up:  3 months  Labs: please stop by the lab during the week get your blood drawn (CBC, CMP, TSH, Lipid profile, HgA1c, Vit D)  Screening: HIV and Hep C   Referrals today-  Pulmonary for lung cancer screening    Please continue to a heart-healthy diet and increase your physical activities. Try to exercise for at least five days a week.      It was a pleasure to see you and I look forward to continuing to work together on your health and well-being. Please do not hesitate to call the office if you need care or have questions about your care.   Have a wonderful day and week. With Gratitude, Gilmore Laroche MSN, FNP-BC

## 2023-05-03 NOTE — Progress Notes (Signed)
New Patient Office Visit  Subjective:  Patient ID: Jeffrey Wilcox, male    DOB: 02-Aug-1966  Age: 57 y.o. MRN: [id number]  CC:  Chief Complaint  Patient presents with   Establish Care    HPI Jeffrey Wilcox is a 57 y.o. male with past medical history of COPD, nicotine dependence, seizure this daughter, major depressive disorder, and hyperlipidemia presents for establishing care. For the details of today's visit, please refer to the assessment and plan.     Past Medical History:  Diagnosis Date   Anxiety    takes Xanax daily as needed   Arthritis, rheumatoid (HCC)    Chronic back pain    DDD   Degenerative disc disease    Degenerative disc disease    Dizziness    r/t seizures per pt   Enlarged prostate    takes Flomax daily   History of bronchitis many yrs ago   History of colon polyps    benign   History of migraine    last one a month ago-takes Maxalt if needed   Hyperlipidemia    takes Simvastatin nightly   Joint pain    Joint swelling    Muscle spasm    takes Flexeril daily as needed   Osteoarthritis    Pneumonia at age 9   "walking"   PTSD (post-traumatic stress disorder)    takes Lexapro daily   Seizures (HCC)    takes Dilantin and Lamictal daily;last seizure was 2 months ago   Severe depression (HCC)    takes Brintellix daily   Shortness of breath dyspnea    with exertion;has Spiriva and Albuterol if needed   Slow urinary stream     Past Surgical History:  Procedure Laterality Date   COLONOSCOPY     INGUINAL HERNIA REPAIR  01/06/2012   Procedure: HERNIA REPAIR INGUINAL ADULT;  Surgeon: Dalia Heading, MD;  Location: [facility];  Service: General;  Laterality: Right;   MULTIPLE TOOTH EXTRACTIONS     TONSILLECTOMY     VAGUS NERVE STIMULATOR INSERTION N/A 07/26/2015   Procedure: VAGAL NERVE STIMULATOR PLACEMENT;  Surgeon: Lisbeth Renshaw, MD;  Location: [facility];  Service: Neurosurgery;  Laterality: N/A;  vagal nerve stimulator placement   VAGUS  NERVE STIMULATOR INSERTION Left 2018   VAGUS NERVE STIMULATOR INSERTION N/A 07/25/2021   Procedure: VAGAL NERVE STIMULATOR BATTERY CHANGE;  Surgeon: Lisbeth Renshaw, MD;  Location: [facility];  Service: Neurosurgery;  Laterality: N/A;    Family History  Adopted: Yes    Social History   Socioeconomic History   Marital status: Divorced    Spouse name: Not on file   Number of children: Not on file   Years of education: Not on file   Highest education level: Not on file  Occupational History   Not on file  Tobacco Use   Smoking status: Every Day    Packs/day: 0.50    Years: 33.00    Additional pack years: 0.00    Total pack years: 16.50    Types: Cigarettes   Smokeless tobacco: Former  Building services engineer Use: Never used  Substance and Sexual Activity   Alcohol use: No    Comment: recovering alcoholic for 12 years   Drug use: Yes    Types: Marijuana    Comment: last time was Easter Sunday   Sexual activity: Not Currently  Other Topics Concern   Not on file  Social History Narrative   Not on file  Social Determinants of Health   Financial Resource Strain: Not on file  Food Insecurity: Not on file  Transportation Needs: Not on file  Physical Activity: Not on file  Stress: Not on file  Social Connections: Not on file  Intimate Partner Violence: Not on file    ROS Review of Systems  Constitutional:  Negative for fatigue and fever.  Eyes:  Negative for visual disturbance.  Respiratory:  Negative for chest tightness and shortness of breath.   Cardiovascular:  Negative for chest pain and palpitations.  Neurological:  Negative for dizziness and headaches.    Objective:   Today's Vitals: BP 139/86   Pulse 76   Ht 6' (1.829 m)   Wt 182 lb 1.9 oz (82.6 kg)   SpO2 94%   BMI 24.70 kg/m   Physical Exam HENT:     Head: Normocephalic.     Right Ear: External ear normal.     Left Ear: External ear normal.     Nose: No congestion or rhinorrhea.     Mouth/Throat:      Mouth: Mucous membranes are moist.  Cardiovascular:     Rate and Rhythm: Regular rhythm.     Heart sounds: No murmur heard. Pulmonary:     Effort: No respiratory distress.     Breath sounds: Normal breath sounds.  Neurological:     Mental Status: He is alert.      Assessment & Plan:   Chronic obstructive pulmonary disease, unspecified COPD type (HCC) Assessment & Plan: Stable on Advair  No reports of shortness of breath, chest pain, palpitation, and dyspnea    Nicotine dependence, uncomplicated, unspecified nicotine product type Assessment & Plan: Last lung cancer screening was 2 years ago He reports smoking since the age of [age] 5 pack of cigarette daily He currently smokes 1 pack of cigarettes daily Smoking cessation encouraged Education as follow up: Smoking is harmful to your health and increases your risk for cancer, COPD, high blood pressure, cataracts, digestive problems, or health problems , such as gum disease, mouth sores, and tooth loss and loss of taste and smell. Smoking irritates your throat and causes coughing.   Referral placed to pulmonary for lung cancer screening   Orders: -     Ambulatory Referral for Lung Cancer Scre  Major depressive disorder, recurrent, moderate (HCC) Assessment & Plan: PHQ-9 is 21 The patient is currently speaking with his psychiatrist who refilled his medications He is currently on Lexapro 20 mg daily and Xanax 1 mg 4 times daily as needed He reports that his psychiatrist is trying to wean him off of Xanax No reports of suicidal thoughts and ideation Encouraged to continue to following up with his psychiatrist as scheduled   Gastroesophageal reflux disease without esophagitis  IFG (impaired fasting glucose) -     Hemoglobin A1c  Vitamin D deficiency -     VITAMIN D 25 Hydroxy (Vit-D Deficiency, Fractures)  Need for hepatitis C screening test -     Hepatitis C antibody  Encounter for screening for HIV -      HIV Antibody (routine testing w rflx)  Other specified hypothyroidism -     TSH + free T4  Other hyperlipidemia -     Lipid panel -     CMP14+EGFR -     CBC with Differential/Platelet  Colon cancer screening -     Cologuard     Follow-up: Return in about 3 months (around 08/03/2023).   Gilmore Laroche, FNP

## 2023-05-03 NOTE — Assessment & Plan Note (Signed)
PHQ-9 is 21 The patient is currently speaking with his psychiatrist who refilled his medications He is currently on Lexapro 20 mg daily and Xanax 1 mg 4 times daily as needed He reports that his psychiatrist is trying to wean him off of Xanax No reports of suicidal thoughts and ideation Encouraged to continue to following up with his psychiatrist as scheduled

## 2023-05-07 DIAGNOSIS — E785 Hyperlipidemia, unspecified: Secondary | ICD-10-CM | POA: Diagnosis not present

## 2023-05-07 DIAGNOSIS — J449 Chronic obstructive pulmonary disease, unspecified: Secondary | ICD-10-CM | POA: Diagnosis not present

## 2023-05-12 ENCOUNTER — Telehealth: Payer: Self-pay | Admitting: Family Medicine

## 2023-05-12 NOTE — Telephone Encounter (Signed)
Heather called from [facility] asked to follow up for care giver services that was spoken to the pcp provider. Asking when will the referral be scheduled Call back # [phone] ext [phone]

## 2023-06-06 DIAGNOSIS — E785 Hyperlipidemia, unspecified: Secondary | ICD-10-CM | POA: Diagnosis not present

## 2023-06-06 DIAGNOSIS — J449 Chronic obstructive pulmonary disease, unspecified: Secondary | ICD-10-CM | POA: Diagnosis not present

## 2023-06-09 ENCOUNTER — Ambulatory Visit: Payer: Medicare Other | Admitting: Neurology

## 2023-06-09 ENCOUNTER — Encounter: Payer: Self-pay | Admitting: Neurology

## 2023-07-07 DIAGNOSIS — J449 Chronic obstructive pulmonary disease, unspecified: Secondary | ICD-10-CM | POA: Diagnosis not present

## 2023-07-07 DIAGNOSIS — E785 Hyperlipidemia, unspecified: Secondary | ICD-10-CM | POA: Diagnosis not present

## 2023-07-13 ENCOUNTER — Ambulatory Visit (INDEPENDENT_AMBULATORY_CARE_PROVIDER_SITE_OTHER): Payer: 59 | Admitting: Neurology

## 2023-07-13 ENCOUNTER — Encounter: Payer: Self-pay | Admitting: Neurology

## 2023-07-13 VITALS — BP 126/74 | Ht 70.0 in | Wt 183.0 lb

## 2023-07-13 DIAGNOSIS — G40019 Localization-related (focal) (partial) idiopathic epilepsy and epileptic syndromes with seizures of localized onset, intractable, without status epilepticus: Secondary | ICD-10-CM | POA: Diagnosis not present

## 2023-07-13 DIAGNOSIS — Z5181 Encounter for therapeutic drug level monitoring: Secondary | ICD-10-CM | POA: Diagnosis not present

## 2023-07-13 DIAGNOSIS — Z9689 Presence of other specified functional implants: Secondary | ICD-10-CM | POA: Diagnosis not present

## 2023-07-13 NOTE — Progress Notes (Signed)
[facility]  PATIENT: Jeffrey Wilcox DOB: 05-Nov-1966  REQUESTING CLINICIAN: Gilmore Laroche, FNP HISTORY FROM: Patient and chart review  REASON FOR VISIT: Establish care for his seizure disorder    HISTORICAL  CHIEF COMPLAINT:  Chief Complaint  Patient presents with   Follow-up    Rm 13 sz follow up, multiple seizures since last visit. Denies SI/HI, VNS   INTERVAL HISTORY 07/13/2023:  Patient presents today for follow-up, last visit was in December, at that time plan was to add clobazam and we also change the VNS settings.  He presents today, stating that he continued to have seizures 1 to 2/month.  In February he had a possible seizure vs [place] and ended up in the hospital.  At that time phenytoin was checked and it was lower than normal.  Patient reports compliance with medications, denies any side effects since adding the clobazam but continued to have seizures once to twice a month, report focal unaware seizures, no generalized convulsion.   HISTORY OF PRESENT ILLNESS:  This is a 57 year old gentleman past medical history of seizure disorder, on Dilantin and lamotrigine, chronic pain, anxiety who is presenting to establish care for his seizure disorder.  He reports seizure started 20 years ago, he has 2 type of seizure, generalized convulsion and staring spells.  Currently he is on lamotrigine 300 mg twice daily and phenytoin 300 mg twice daily and still having seizures about once to twice a month his last seizure was 2 weeks ago.  With his epilepsy has tried multiple medication in the past including Tegretol, Depakote, phenobarbital, and currently has a VNS.  Handedness: Right hand  Onset: 20 years ago  Seizure Type: Staring spells, generalized convulsion   Current frequency: 1 to 2 a month, last seizure 2 weeks ago   Any injuries from seizures: Broken bone, head injury   Seizure risk factors: Great uncle   Previous ASMs:  Tegretol, Depakote, Phenobarb  Currenty ASMs: Lamotrigine 300 mg BID , Dilantin 300 mg BID, Clobazam 10 mg nightly  ASMs side effects: Denies   Brain Images: CT head 2014: mild atrophy   Previous EEGs: none available for review    OTHER MEDICAL CONDITIONS: Epilepsy intractable, chronic pain, anxiety  REVIEW OF SYSTEMS: Full 14 system review of systems performed and negative with exception of: As noted in the HPI   ALLERGIES: Allergies  Allergen Reactions   Other Anaphylaxis and Rash    [place], roses    Penicillins Other (See Comments)    Hallucination    HOME MEDICATIONS: Outpatient Medications Prior to Visit  Medication Sig Dispense Refill   albuterol (PROVENTIL HFA;VENTOLIN HFA) 108 (90 BASE) MCG/ACT inhaler Inhale 1-2 puffs into the lungs every 6 (six) hours as needed for wheezing or shortness of breath.     ALPRAZolam (XANAX) 1 MG tablet Take 1 mg by mouth 4 (four) times daily as needed. For anxiety/sleep     cholecalciferol (VITAMIN D3) 25 MCG (1000 UNIT) tablet Take 1,000 Units by mouth daily.     cyclobenzaprine (FLEXERIL) 10 MG tablet Take 10 mg by mouth 2 (two) times a day. Muscle Spasms.     diphenhydrAMINE (BENADRYL) 25 mg capsule Take 25 mg by mouth daily as needed for allergies.     escitalopram (LEXAPRO) 20 MG tablet Take 20 mg by mouth 2 (two) times a day.     fluticasone-salmeterol (ADVAIR) 250-50 MCG/ACT AEPB Inhale 1 puff into the lungs in the morning and  at bedtime.     ibuprofen (ADVIL) 800 MG tablet Take 800 mg by mouth daily as needed.     LamoTRIgine 300 MG TB24 24 hour tablet Take 300 mg by mouth 2 (two) times a day.     omeprazole (PRILOSEC) 20 MG capsule Take 20 mg by mouth daily.     phenytoin (DILANTIN) 300 MG ER capsule Take 300 mg by mouth 2 (two) times daily.     propranolol (INDERAL) 10 MG tablet Take 10 mg by mouth 2 (two) times daily.     rizatriptan (MAXALT-MLT) 10 MG disintegrating tablet Take 10 mg by mouth as needed for migraine. May  repeat in 2 hours if needed     simvastatin (ZOCOR) 20 MG tablet Take 20 mg by mouth at bedtime.      WIXELA INHUB 250-50 MCG/ACT AEPB 1 puff 2 (two) times daily.     cloBAZam (ONFI) 10 MG tablet Take 1 tablet (10 mg total) by mouth at bedtime. 30 tablet 5   No facility-administered medications prior to visit.    PAST MEDICAL HISTORY: Past Medical History:  Diagnosis Date   Anxiety    takes Xanax daily as needed   Arthritis, rheumatoid (HCC)    Chronic back pain    DDD   Degenerative disc disease    Degenerative disc disease    Dizziness    r/t seizures per pt   Enlarged prostate    takes Flomax daily   History of bronchitis many yrs ago   History of colon polyps    benign   History of migraine    last one a month ago-takes Maxalt if needed   Hyperlipidemia    takes Simvastatin nightly   Joint pain    Joint swelling    Muscle spasm    takes Flexeril daily as needed   Osteoarthritis    Pneumonia at age 52   "walking"   PTSD (post-traumatic stress disorder)    takes Lexapro daily   Seizures (HCC)    takes Dilantin and Lamictal daily;last seizure was 2 months ago   Severe depression (HCC)    takes Brintellix daily   Shortness of breath dyspnea    with exertion;has Spiriva and Albuterol if needed   Slow urinary stream     PAST SURGICAL HISTORY: Past Surgical History:  Procedure Laterality Date   COLONOSCOPY     INGUINAL HERNIA REPAIR  01/06/2012   Procedure: HERNIA REPAIR INGUINAL ADULT;  Surgeon: Dalia Heading, MD;  Location: [facility];  Service: General;  Laterality: Right;   MULTIPLE TOOTH EXTRACTIONS     TONSILLECTOMY     VAGUS NERVE STIMULATOR INSERTION N/A 07/26/2015   Procedure: VAGAL NERVE STIMULATOR PLACEMENT;  Surgeon: Lisbeth Renshaw, MD;  Location: [facility];  Service: Neurosurgery;  Laterality: N/A;  vagal nerve stimulator placement   VAGUS NERVE STIMULATOR INSERTION Left 2018   VAGUS NERVE STIMULATOR INSERTION N/A 07/25/2021   Procedure: VAGAL  NERVE STIMULATOR BATTERY CHANGE;  Surgeon: Lisbeth Renshaw, MD;  Location: [facility];  Service: Neurosurgery;  Laterality: N/A;    FAMILY HISTORY: Family History  Adopted: Yes    SOCIAL HISTORY: Social History   Socioeconomic History   Marital status: Divorced    Spouse name: Not on file   Number of children: Not on file   Years of education: Not on file   Highest education level: Not on file  Occupational History   Not on file  Tobacco Use   Smoking status:  Every Day    Current packs/day: 0.50    Average packs/day: 0.5 packs/day for 33.0 years (16.5 ttl pk-yrs)    Types: Cigarettes   Smokeless tobacco: Former  Building services engineer status: Never Used  Substance and Sexual Activity   Alcohol use: No    Comment: recovering alcoholic for 12 years   Drug use: Yes    Types: Marijuana    Comment: last time was Easter Sunday   Sexual activity: Not Currently  Other Topics Concern   Not on file  Social History Narrative   Not on file   Social Determinants of Health   Financial Resource Strain: Not on file  Food Insecurity: Not on file  Transportation Needs: Not on file  Physical Activity: Not on file  Stress: Not on file  Social Connections: Not on file  Intimate Partner Violence: Not on file    PHYSICAL EXAM  GENERAL EXAM/CONSTITUTIONAL: Vitals:  Vitals:   07/13/23 0751  BP: 126/74  Weight: 183 lb (83 kg)  Height: 5\' 10"  (1.778 m)    Body mass index is 26.26 kg/m. Wt Readings from Last 3 Encounters:  07/13/23 183 lb (83 kg)  05/03/23 182 lb 1.9 oz (82.6 kg)  11/03/22 190 lb 14.7 oz (86.6 kg)   Patient is in no distress; well developed, nourished and groomed; neck is supple  MUSCULOSKELETAL: Gait, strength, tone, movements noted in Neurologic exam below  NEUROLOGIC: MENTAL STATUS:      No data to display         awake, alert, oriented to person, place and time recent and remote memory intact normal attention and concentration language fluent,  comprehension intact, naming intact fund of knowledge appropriate  CRANIAL NERVE:  2nd, 3rd, 4th, 6th - Visual fields full to confrontation, extraocular muscles intact, no nystagmus 5th - facial sensation symmetric 7th - facial strength symmetric 8th - hearing intact 9th - palate elevates symmetrically, uvula midline 11th - shoulder shrug symmetric 12th - tongue protrusion midline  MOTOR:  normal bulk and tone, full strength in the BUE, BLE  SENSORY:  normal and symmetric to light touch  COORDINATION:  finger-nose-finger, fine finger movements normal  GAIT/STATION:  normal   DIAGNOSTIC DATA (LABS, IMAGING, TESTING) - I reviewed patient records, labs, notes, testing and imaging myself where available.  Lab Results  Component Value Date   WBC 7.3 01/03/2023   HGB 17.2 (H) 01/03/2023   HCT 51.7 01/03/2023   MCV 92.7 01/03/2023   PLT 160 01/03/2023      Component Value Date/Time   NA 136 01/03/2023 1202   NA 143 10/22/2022 0907   K 4.8 01/03/2023 1202   CL 107 01/03/2023 1202   CO2 25 01/03/2023 1202   GLUCOSE 99 01/03/2023 1202   BUN 15 01/03/2023 1202   BUN 11 10/22/2022 0907   CREATININE 0.83 01/03/2023 1202   CALCIUM 9.5 01/03/2023 1202   PROT 7.6 10/22/2022 0907   ALBUMIN 4.6 10/22/2022 0907   AST 18 10/22/2022 0907   ALT 21 10/22/2022 0907   ALKPHOS 212 (H) 10/22/2022 0907   BILITOT <0.2 10/22/2022 0907   GFRNONAA >60 01/03/2023 1202   GFRAA >60 07/17/2015 0948   No results found for: "CHOL", "HDL", "LDLCALC", "LDLDIRECT", "TRIG" No results found for: "HGBA1C" No results found for: "VITAMINB12" No results found for: "TSH"    ASSESSMENT AND PLAN  57 y.o. year old male  with history of epilepsy, intractable status post VNS therapy, chronic pain, anxiety who  is presenting for follow up.  He continues to have 1-2 seizures despite being on 3 antiseizure medications and VNS.  However he does not swipe his VNS daily.  Plan will be to check his antiseizure  medication level, his last phenytoin levels were low, if today it is low, we will likely increase the phenytoin to 450 mg twice daily.  We also discussed the importance of swiping his VNS, again advised patient to swipe his VNS at least twice a day.  Advised him to contact me if he has a breakthrough seizure otherwise I will see him in 6 months for follow-up or sooner if worse.    1. Partial idiopathic epilepsy with seizures of localized onset, intractable, without status epilepticus (HCC)   2. Therapeutic drug monitoring   3. S/P placement of VNS (vagus nerve stimulation) device      Patient Instructions  Continue current medications   Lamotrigine 300 mg BID  Phenytoin 300 mg BID   Clobazam 10 mg nightly  Will check ASM level today and adjust dose as necessary  VNS interrogated today, no changes made  Follow up in 6 months   Per [employer] [facility] statutes, patients with seizures are not allowed to drive until they have been seizure-free for six months.  Other recommendations include using caution when using heavy equipment or power tools. Avoid working on ladders or at heights. Take showers instead of baths.  Do not swim alone.  Ensure the water temperature is not too high on the home water heater. Do not go swimming alone. Do not lock yourself in a room alone (i.e. bathroom). When caring for infants or small children, sit down when holding, feeding, or changing them to minimize risk of injury to the child in the event you have a seizure. Maintain good sleep hygiene. Avoid alcohol.  Also recommend adequate sleep, hydration, good diet and minimize stress.   During the Seizure  - First, ensure adequate ventilation and place patients on the floor on their left side  Loosen clothing around the neck and ensure the airway is patent. If the patient is clenching the teeth, do not force the mouth open with any object as this can cause severe damage - Remove all items from the surrounding that can  be hazardous. The patient may be oblivious to what's happening and may not even know what he or she is doing. If the patient is confused and wandering, either gently guide him/her away and block access to outside areas - Reassure the individual and be comforting - Call 911. In most cases, the seizure ends before EMS arrives. However, there are cases when seizures may last over 3 to 5 minutes. Or the individual may have developed breathing difficulties or severe injuries. If a pregnant patient or a person with diabetes develops a seizure, it is prudent to call an ambulance. - Finally, if the patient does not regain full consciousness, then call EMS. Most patients will remain confused for about 45 to 90 minutes after a seizure, so you must use judgment in calling for help. - Avoid restraints but make sure the patient is in a bed with padded side rails - Place the individual in a lateral position with the neck slightly flexed; this will help the saliva drain from the mouth and prevent the tongue from falling backward - Remove all nearby furniture and other hazards from the area - Provide verbal assurance as the individual is regaining consciousness - Provide the patient with privacy if  possible - Call for help and start treatment as ordered by the caregiver   After the Seizure (Postictal Stage)  After a seizure, most patients experience confusion, fatigue, muscle pain and/or a headache. Thus, one should permit the individual to sleep. For the next few days, reassurance is essential. Being calm and helping reorient the person is also of importance.  Most seizures are painless and end spontaneously. Seizures are not harmful to others but can lead to complications such as stress on the lungs, brain and the heart. Individuals with prior lung problems may develop labored breathing and respiratory distress.     Orders Placed This Encounter  Procedures   Phenytoin Level, Total   CLOBAZAM   Lamotrigine  level   CMP    No orders of the defined types were placed in this encounter.   Return in about 6 months (around 01/10/2024).    Windell Norfolk, MD 07/13/2023, 8:14 AM  [facility] [address] [place], [place] [address] [phone]

## 2023-07-13 NOTE — Patient Instructions (Signed)
Continue current medications   Lamotrigine 300 mg BID  Phenytoin 300 mg BID   Clobazam 10 mg nightly  Will check ASM level today and adjust dose as necessary  VNS interrogated today, no changes made  Follow up in 6 months

## 2023-07-15 ENCOUNTER — Telehealth: Payer: Self-pay

## 2023-07-15 ENCOUNTER — Other Ambulatory Visit: Payer: Self-pay | Admitting: Neurology

## 2023-07-15 DIAGNOSIS — Z5181 Encounter for therapeutic drug level monitoring: Secondary | ICD-10-CM

## 2023-07-15 DIAGNOSIS — G40019 Localization-related (focal) (partial) idiopathic epilepsy and epileptic syndromes with seizures of localized onset, intractable, without status epilepticus: Secondary | ICD-10-CM

## 2023-07-15 MED ORDER — PHENYTOIN SODIUM EXTENDED 300 MG PO CAPS: 300.0000 mg | ORAL_CAPSULE | Freq: Three times a day (TID) | ORAL | 3 refills | Status: DC

## 2023-07-15 MED ORDER — CLOBAZAM 10 MG PO TABS: 10.0000 mg | ORAL_TABLET | Freq: Every evening | ORAL | 5 refills | Status: AC

## 2023-07-15 NOTE — Telephone Encounter (Signed)
-----   Message from [facility] sent at 07/15/2023  9:59 AM EDT ----- Please call patient and advised that his phenytoin level is low. It has been low in the previous tests that we have done. Plan will be to increase the phenytoin to 300 mg three time daily. Please have patient come back in a month for lab works.   Dr. Teresa Coombs

## 2023-07-15 NOTE — Progress Notes (Signed)
Please call patient and advised that his phenytoin level is low. It has been low in the previous tests that we have done. Plan will be to increase the phenytoin to 300 mg three time daily. Please have patient come back in a month for lab works.   Dr. Teresa Coombs

## 2023-07-15 NOTE — Telephone Encounter (Signed)
Called and LVM per DPR  IF PATIENT CALLS BACK PLEASE SCHEDULE LABS FOR ONE MONTH FROM NOW. THANK YOU

## 2023-07-19 LAB — COMPREHENSIVE METABOLIC PANEL
ALT: 18 IU/L (ref 0–44)
AST: 14 IU/L (ref 0–40)
Albumin: 4 g/dL (ref 3.8–4.9)
Alkaline Phosphatase: 188 IU/L — ABNORMAL HIGH (ref 44–121)
BUN/Creatinine Ratio: 13 (ref 9–20)
BUN: 10 mg/dL (ref 6–24)
Bilirubin Total: <0.2 mg/dL (ref 0.0–1.2)
CO2: 25 mmol/L (ref 20–29)
Calcium: 9.3 mg/dL (ref 8.7–10.2)
Chloride: 106 mmol/L (ref 96–106)
Creatinine, Ser: 0.76 mg/dL (ref 0.76–1.27)
Globulin, Total: 2.4 g/dL (ref 1.5–4.5)
Glucose: 107 mg/dL — ABNORMAL HIGH (ref 70–99)
Potassium: 4.4 mmol/L (ref 3.5–5.2)
Sodium: 142 mmol/L (ref 134–144)
Total Protein: 6.4 g/dL (ref 6.0–8.5)
eGFR: 105 mL/min/{1.73_m2} (ref >59)

## 2023-07-19 LAB — LAMOTRIGINE LEVEL: Lamotrigine Lvl: 5.4 ug/mL (ref 2.0–20.0)

## 2023-07-19 LAB — CLOBAZAM
Clobazam: <10 ng/mL — ABNORMAL LOW (ref 30–300)
Desmethylclobazam: <50 ng/mL — ABNORMAL LOW (ref 300–3000)

## 2023-07-19 LAB — PHENYTOIN LEVEL, TOTAL: Phenytoin (Dilantin), Serum: 1.2 ug/mL — ABNORMAL LOW (ref 10.0–20.0)

## 2023-07-19 MED ORDER — PHENYTOIN SODIUM EXTENDED 300 MG PO CAPS: 300.0000 mg | ORAL_CAPSULE | Freq: Three times a day (TID) | ORAL | 3 refills | Status: AC

## 2023-07-19 NOTE — Telephone Encounter (Signed)
Called and spoke to pt. Result given he requested refill of phentynoin I sent in refill as requested

## 2023-07-19 NOTE — Progress Notes (Signed)
Both his Phenytoin and Clobazam levels were low. Pleas advise him to take his medication as prescribed to avoid breakthrough seizures.

## 2023-07-19 NOTE — Addendum Note (Signed)
Addended by: Eather Colas E on: 07/19/2023 10:12 AM   Modules accepted: Orders

## 2023-08-03 ENCOUNTER — Ambulatory Visit: Payer: 59 | Admitting: Family Medicine

## 2023-08-07 DIAGNOSIS — J449 Chronic obstructive pulmonary disease, unspecified: Secondary | ICD-10-CM | POA: Diagnosis not present

## 2023-08-07 DIAGNOSIS — E785 Hyperlipidemia, unspecified: Secondary | ICD-10-CM | POA: Diagnosis not present

## 2023-08-13 ENCOUNTER — Other Ambulatory Visit: Payer: Self-pay | Admitting: Family Medicine

## 2023-08-13 DIAGNOSIS — Z1211 Encounter for screening for malignant neoplasm of colon: Secondary | ICD-10-CM

## 2023-08-13 DIAGNOSIS — Z1212 Encounter for screening for malignant neoplasm of rectum: Secondary | ICD-10-CM

## 2023-09-02 DIAGNOSIS — I10 Essential (primary) hypertension: Secondary | ICD-10-CM | POA: Diagnosis not present

## 2023-09-02 DIAGNOSIS — J449 Chronic obstructive pulmonary disease, unspecified: Secondary | ICD-10-CM | POA: Diagnosis not present

## 2023-09-02 DIAGNOSIS — R569 Unspecified convulsions: Secondary | ICD-10-CM | POA: Diagnosis not present

## 2023-09-02 DIAGNOSIS — Z23 Encounter for immunization: Secondary | ICD-10-CM | POA: Diagnosis not present

## 2023-10-03 DIAGNOSIS — E785 Hyperlipidemia, unspecified: Secondary | ICD-10-CM | POA: Diagnosis not present

## 2023-10-03 DIAGNOSIS — J449 Chronic obstructive pulmonary disease, unspecified: Secondary | ICD-10-CM | POA: Diagnosis not present

## 2023-11-02 DIAGNOSIS — J449 Chronic obstructive pulmonary disease, unspecified: Secondary | ICD-10-CM | POA: Diagnosis not present

## 2023-11-02 DIAGNOSIS — E785 Hyperlipidemia, unspecified: Secondary | ICD-10-CM | POA: Diagnosis not present

## 2023-11-24 DIAGNOSIS — K219 Gastro-esophageal reflux disease without esophagitis: Secondary | ICD-10-CM | POA: Diagnosis not present

## 2023-11-24 DIAGNOSIS — E785 Hyperlipidemia, unspecified: Secondary | ICD-10-CM | POA: Diagnosis not present

## 2023-11-24 DIAGNOSIS — Z0001 Encounter for general adult medical examination with abnormal findings: Secondary | ICD-10-CM | POA: Diagnosis not present

## 2023-11-24 DIAGNOSIS — I10 Essential (primary) hypertension: Secondary | ICD-10-CM | POA: Diagnosis not present

## 2023-11-29 ENCOUNTER — Other Ambulatory Visit: Payer: Self-pay

## 2023-11-29 ENCOUNTER — Emergency Department (HOSPITAL_COMMUNITY)
Admission: EM | Admit: 2023-11-29 | Discharge: 2023-11-29 | Disposition: A | Discharge status: Home / Self Care | Payer: 59 | Attending: Emergency Medicine | Admitting: Emergency Medicine

## 2023-11-29 ENCOUNTER — Encounter (HOSPITAL_COMMUNITY): Payer: Self-pay

## 2023-11-29 DIAGNOSIS — E876 Hypokalemia: Secondary | ICD-10-CM | POA: Diagnosis not present

## 2023-11-29 DIAGNOSIS — R9431 Abnormal electrocardiogram [ECG] [EKG]: Secondary | ICD-10-CM | POA: Diagnosis not present

## 2023-11-29 DIAGNOSIS — R799 Abnormal finding of blood chemistry, unspecified: Secondary | ICD-10-CM | POA: Diagnosis not present

## 2023-11-29 DIAGNOSIS — Z0189 Encounter for other specified special examinations: Secondary | ICD-10-CM

## 2023-11-29 LAB — CBC WITH DIFFERENTIAL/PLATELET
Abs Immature Granulocytes: 0.02 10*3/uL (ref 0.00–0.07)
Basophils Absolute: 0 10*3/uL (ref 0.0–0.1)
Basophils Relative: 1 %
Eosinophils Absolute: 0.1 10*3/uL (ref 0.0–0.5)
Eosinophils Relative: 1 %
HCT: 53.9 % — ABNORMAL HIGH (ref 39.0–52.0)
Hemoglobin: 18.3 g/dL — ABNORMAL HIGH (ref 13.0–17.0)
Immature Granulocytes: 0 %
Lymphocytes Relative: 30 %
Lymphs Abs: 2.5 10*3/uL (ref 0.7–4.0)
MCH: 30 pg (ref 26.0–34.0)
MCHC: 34 g/dL (ref 30.0–36.0)
MCV: 88.5 fL (ref 80.0–100.0)
Monocytes Absolute: 0.7 10*3/uL (ref 0.1–1.0)
Monocytes Relative: 8 %
Neutro Abs: 5.1 10*3/uL (ref 1.7–7.7)
Neutrophils Relative %: 60 %
Platelets: 221 10*3/uL (ref 150–400)
RBC: 6.09 MIL/uL — ABNORMAL HIGH (ref 4.22–5.81)
RDW: 13.4 % (ref 11.5–15.5)
WBC: 8.4 10*3/uL (ref 4.0–10.5)
nRBC: 0 % (ref 0.0–0.2)

## 2023-11-29 LAB — BASIC METABOLIC PANEL
Anion gap: 14 (ref 5–15)
BUN: 18 mg/dL (ref 6–20)
CO2: 25 mmol/L (ref 22–32)
Calcium: 9.7 mg/dL (ref 8.9–10.3)
Chloride: 96 mmol/L — ABNORMAL LOW (ref 98–111)
Creatinine, Ser: 0.92 mg/dL (ref 0.61–1.24)
GFR, Estimated: >60 mL/min (ref >60)
Glucose, Bld: 104 mg/dL — ABNORMAL HIGH (ref 70–99)
Potassium: 3.2 mmol/L — ABNORMAL LOW (ref 3.5–5.1)
Sodium: 135 mmol/L (ref 135–145)

## 2023-11-29 NOTE — ED Provider Notes (Signed)
[place] EMERGENCY DEPARTMENT AT [facility] Provider Note   CSN: [id number] Arrival date & time: 11/29/23  1552     History {Add pertinent medical, surgical, social history, OB history to HPI:1} Chief Complaint  Patient presents with   Labs Only    Jeffrey Wilcox is a 58 y.o. male.  58 year old male with history of seizures on phenytoin and Lamictal and anxiety presents emergency department with abnormal lab work.  Patient reports that last week he had labs drawn that showed a sodium of 165 and potassium 6.2.  Denies any symptoms at this time.  No muscle cramps, palpitations, urinary changes, or confusion.  Says that he has been staying well-hydrated.       Home Medications Prior to Admission medications   Medication Sig Start Date End Date Taking? Authorizing Provider  albuterol (PROVENTIL HFA;VENTOLIN HFA) 108 (90 BASE) MCG/ACT inhaler Inhale 1-2 puffs into the lungs every 6 (six) hours as needed for wheezing or shortness of breath.    [provider]  ALPRAZolam Prudy Feeler) 1 MG tablet Take 1 mg by mouth 4 (four) times daily as needed. For anxiety/sleep 09/22/13   [provider]  cholecalciferol (VITAMIN D3) 25 MCG (1000 UNIT) tablet Take 1,000 Units by mouth daily.    [provider]  cloBAZam (ONFI) 10 MG tablet Take 1 tablet (10 mg total) by mouth at bedtime. 07/15/23 01/11/24  Windell Norfolk, MD  cyclobenzaprine (FLEXERIL) 10 MG tablet Take 10 mg by mouth 2 (two) times a day. Muscle Spasms.    [provider]  diphenhydrAMINE (BENADRYL) 25 mg capsule Take 25 mg by mouth daily as needed for allergies.    [provider]  escitalopram (LEXAPRO) 20 MG tablet Take 20 mg by mouth 2 (two) times a day. 03/31/19   [provider]  fluticasone-salmeterol (ADVAIR) 250-50 MCG/ACT AEPB Inhale 1 puff into the lungs in the morning and at bedtime.    [provider]  ibuprofen (ADVIL) 800 MG tablet Take 800 mg by mouth  daily as needed. 12/19/22   [provider]  LamoTRIgine 300 MG TB24 24 hour tablet Take 300 mg by mouth 2 (two) times a day. 01/02/19   [provider]  omeprazole (PRILOSEC) 20 MG capsule Take 20 mg by mouth daily. 04/02/19   [provider]  phenytoin (DILANTIN) 300 MG ER capsule Take 1 capsule (300 mg total) by mouth 3 (three) times daily. 07/19/23 07/13/24  Windell Norfolk, MD  propranolol (INDERAL) 10 MG tablet Take 10 mg by mouth 2 (two) times daily. 05/31/23   [provider]  rizatriptan (MAXALT-MLT) 10 MG disintegrating tablet Take 10 mg by mouth as needed for migraine. May repeat in 2 hours if needed    [provider]  simvastatin (ZOCOR) 20 MG tablet Take 20 mg by mouth at bedtime.     [provider]  Monte Fantasia INHUB 250-50 MCG/ACT AEPB 1 puff 2 (two) times daily. 01/15/23   [provider]      Allergies    Other and Penicillins    Review of Systems   Review of Systems  Physical Exam Updated Vital Signs BP (!) 171/109   Pulse 91   Temp 98.5 F (36.9 C) (Oral)   Resp 18   Ht 5\' 11"  (1.803 m)   Wt 80.8 kg   SpO2 98%   BMI 24.84 kg/m  Physical Exam Vitals and nursing note reviewed.  Constitutional:      General: He is  not in acute distress.    Appearance: He is well-developed.  HENT:     Head: Normocephalic and atraumatic.     Right Ear: External ear normal.     Left Ear: External ear normal.     Nose: Nose normal.  Eyes:     Extraocular Movements: Extraocular movements intact.     Conjunctiva/sclera: Conjunctivae normal.     Pupils: Pupils are equal, round, and reactive to light.  Cardiovascular:     Rate and Rhythm: Normal rate and regular rhythm.     Heart sounds: Normal heart sounds.  Pulmonary:     Effort: Pulmonary effort is normal. No respiratory distress.     Breath sounds: Normal breath sounds.  Abdominal:     Palpations: Abdomen is soft.  Musculoskeletal:     Cervical back: Normal range of motion  and neck supple.     Right lower leg: No edema.     Left lower leg: No edema.  Skin:    General: Skin is warm and dry.  Neurological:     Mental Status: He is alert. Mental status is at baseline.     Comments: Moves all 4 extremities.  Cranial nerves II through XII grossly intact.  Psychiatric:        Mood and Affect: Mood normal.        Behavior: Behavior normal.     ED Results / Procedures / Treatments   Labs (all labs ordered are listed, but only abnormal results are displayed) Labs Reviewed  BASIC METABOLIC PANEL  CBC WITH DIFFERENTIAL/PLATELET    EKG EKG Interpretation Date/Time:  Monday November 29 2023 16:19:36 EST Ventricular Rate:  89 PR Interval:  202 QRS Duration:  94 QT Interval:  385 QTC Calculation: 472 R Axis:   49  Text Interpretation: Sinus rhythm Borderline prolonged PR interval Borderline T abnormalities, anterior leads Confirmed by Vonita Moss [phone]) on 11/29/2023 4:31:06 PM  Radiology No results found.  Procedures Procedures  {Document cardiac monitor, telemetry assessment procedure when appropriate:1}  Medications Ordered in ED Medications - No data to display  ED Course/ Medical Decision Making/ A&P   {   Click here for ABCD2, HEART and other calculatorsREFRESH Note before signing :1}                              Medical Decision Making Amount and/or Complexity of Data Reviewed Labs: ordered.   Jeffrey Wilcox is a 58 y.o. male with comorbidities that complicate the patient evaluation including seizures on phenytoin and Lamictal and anxiety presents emergency department with abnormal lab work.    Initial Ddx:  ***   MDM/Course:  *** Upon re-evaluation ***  This patient presents to the ED for concern of complaints listed in HPI, this involves an extensive number of treatment options, and is a complaint that carries with it a high risk of complications and morbidity. Disposition including potential need for admission considered.    Dispo: {Disposition:28069}  Additional history obtained from {Additional History:28067} Records reviewed {Records Reviewed:28068} The following labs were independently interpreted: {labs interpreted:28064} and show {lab findings:28250} I independently reviewed the following imaging with scope of interpretation limited to determining acute life threatening conditions related to emergency care: {imaging interpreted:28065} and agree with the radiologist interpretation with the following exceptions: none I personally reviewed and interpreted cardiac monitoring: {cardiac monitoring:28251} I personally reviewed and interpreted the pt's EKG: see above for interpretation  I have reviewed  the patients home medications and made adjustments as needed Consults: {Consultants:28063} Social Determinants of health:  ***  Portions of this note were generated with Scientist, clinical (histocompatibility and immunogenetics). Dictation errors may occur despite best attempts at proofreading.    {Document your decision making why or why not admission, treatments were needed:1} Final Clinical Impression(s) / ED Diagnoses Final diagnoses:  None    Rx / DC Orders ED Discharge Orders     None

## 2023-11-29 NOTE — Discharge Instructions (Addendum)
You were seen for your abnormal blood work in the emergency department. Your blood work today was normal.   Check your MyChart online for the results of any tests that had not resulted by the time you left the emergency department.   Follow-up with your primary doctor in 2-3 days regarding your visit.    Return immediately to the emergency department if you experience any of the following: severe muscle cramps, or any other concerning symptoms.    Thank you for visiting our Emergency Department. It was a pleasure taking care of you today.

## 2023-11-29 NOTE — ED Triage Notes (Signed)
Pt arrived via POV from home due to being called by Dr Letitia Neri office regarding recent lab work they drew last week. Per Dr Letitia Neri office, Pt's K+ is 6.2, Na+ 165. Pt is asymptomatic, presents in NAD.

## 2023-12-03 ENCOUNTER — Telehealth: Payer: Self-pay

## 2023-12-03 DIAGNOSIS — J449 Chronic obstructive pulmonary disease, unspecified: Secondary | ICD-10-CM | POA: Diagnosis not present

## 2023-12-03 DIAGNOSIS — E785 Hyperlipidemia, unspecified: Secondary | ICD-10-CM | POA: Diagnosis not present

## 2023-12-03 NOTE — Progress Notes (Signed)
Transition Care Management Unsuccessful Follow-up Telephone Call  Date of discharge and from where:  [facility]  Attempts:  1st Attempt  Reason for unsuccessful TCM follow-up call:  No answer/busy   Lenard Forth [facility] Guide, Phone: [phone] Fax: [phone] Website: [email or web address]

## 2023-12-06 ENCOUNTER — Telehealth: Payer: Self-pay

## 2023-12-06 NOTE — Progress Notes (Signed)
Transition Care Management Follow-up Telephone Call Date of discharge and from where: [facility] How have you been since you were released from the hospital? Patient is following up with providers Any questions or concerns? No  Items Reviewed: Did the pt receive and understand the discharge instructions provided? Yes  Medications obtained and verified? Yes  Other? No  Any new allergies since your discharge? No  Dietary orders reviewed? No Do you have support at home? Yes     Follow up appointments reviewed:  PCP Hospital f/u appt confirmed? Yes  Scheduled to see  on  @ . Specialist Hospital f/u appt confirmed? No  Scheduled to see  on  @ . Are transportation arrangements needed? No  If their condition worsens, is the pt aware to call PCP or go to the Emergency Dept.? Yes Was the patient provided with contact information for the PCP's office or ED? Yes Was to pt encouraged to call back with questions or concerns? Yes

## 2024-01-03 DIAGNOSIS — J449 Chronic obstructive pulmonary disease, unspecified: Secondary | ICD-10-CM | POA: Diagnosis not present

## 2024-01-03 DIAGNOSIS — E785 Hyperlipidemia, unspecified: Secondary | ICD-10-CM | POA: Diagnosis not present

## 2024-01-17 ENCOUNTER — Ambulatory Visit: Payer: 59 | Admitting: Neurology

## 2024-01-26 ENCOUNTER — Telehealth: Payer: Self-pay | Admitting: Neurology

## 2024-01-26 NOTE — Telephone Encounter (Signed)
 Pt has called to r/s a f/u appointment, he has confirmed there are no new issues, he has accepted a f/u with a NP. This is FYI no call back requested.

## 2024-01-31 DIAGNOSIS — E785 Hyperlipidemia, unspecified: Secondary | ICD-10-CM | POA: Diagnosis not present

## 2024-01-31 DIAGNOSIS — J449 Chronic obstructive pulmonary disease, unspecified: Secondary | ICD-10-CM | POA: Diagnosis not present

## 2024-02-17 ENCOUNTER — Encounter: Payer: Self-pay | Admitting: Neurology

## 2024-02-17 ENCOUNTER — Ambulatory Visit: Admitting: Neurology

## 2024-02-17 NOTE — Progress Notes (Deleted)
 [facility]  PATIENT: [facility] DOB: 17-Jun-1966  REQUESTING CLINICIAN: Avon Gully Demissie* HISTORY FROM: Patient and chart review  REASON FOR VISIT: Establish care for his seizure disorder    HISTORICAL  CHIEF COMPLAINT:  No chief complaint on file.  Update 02/17/24 SS: Last visit no VNS changes made.  Last Dilantin level was low at 1.2, Lamictal 5.4, clobazam less than 10, CMP alkaline phos 188.  Dilantin was increased 300 mg 3 times daily.   INTERVAL HISTORY 07/13/2023:  Patient presents today for follow-up, last visit was in December, at that time plan was to add clobazam and we also change the VNS settings.  He presents today, stating that he continued to have seizures 1 to 2/month.  In February he had [place] possible seizure vs [place] and [place] up in the hospital.  At that time phenytoin was checked and it was lower than normal.  Patient reports compliance with medications, denies any side effects since adding the clobazam but continued to have seizures once to twice a month, report focal unaware seizures, no generalized convulsion.   HISTORY OF PRESENT ILLNESS:  This is a 58 year old gentleman past medical history of seizure disorder, on Dilantin and lamotrigine, chronic pain, anxiety who is presenting to establish care for his seizure disorder.  He reports seizure started 20 years ago, he has 2 type of seizure, generalized convulsion and staring spells.  Currently he is on lamotrigine 300 mg twice daily and phenytoin 300 mg twice daily and still having seizures about once to twice a month his last seizure was 2 weeks ago.  With his epilepsy has tried multiple medication in the past including Tegretol, Depakote, phenobarbital, and currently has a VNS.  Handedness: Right hand  Onset: 20 years ago  Seizure Type: Staring spells, generalized convulsion   Current frequency: 1 to 2 a month, last seizure 2 weeks ago   Any injuries from  seizures: Broken bone, head injury   Seizure risk factors: Great uncle   Previous ASMs: Tegretol, Depakote, Phenobarb  Currenty ASMs: Lamotrigine 300 mg BID , Dilantin 300 mg BID, Clobazam 10 mg nightly  ASMs side effects: Denies   Brain Images: CT head 2014: mild atrophy   Previous EEGs: none available for review    OTHER MEDICAL CONDITIONS: Epilepsy intractable, chronic pain, anxiety  REVIEW OF SYSTEMS: Full 14 system review of systems performed and negative with exception of: As noted in the HPI   ALLERGIES: Allergies  Allergen Reactions   Other Anaphylaxis and Rash    [place], roses    Penicillins Other (See Comments)    Hallucination    HOME MEDICATIONS: Outpatient Medications Prior to Visit  Medication Sig Dispense Refill   albuterol (PROVENTIL HFA;VENTOLIN HFA) 108 (90 BASE) MCG/ACT inhaler Inhale 1-2 puffs into the lungs every 6 (six) hours as needed for wheezing or shortness of breath.     ALPRAZolam (XANAX) 1 MG tablet Take 1 mg by mouth 4 (four) times daily as needed. For anxiety/sleep     cholecalciferol (VITAMIN D3) 25 MCG (1000 UNIT) tablet Take 1,000 Units by mouth daily.     cloBAZam (ONFI) 10 MG tablet Take 1 tablet (10 mg total) by mouth at bedtime. 30 tablet 5   cyclobenzaprine (FLEXERIL) 10 MG tablet Take 10 mg by mouth 2 (two) times a day. Muscle Spasms.     diphenhydrAMINE (BENADRYL) 25 mg capsule Take 25 mg by mouth daily as needed for allergies.  escitalopram (LEXAPRO) 20 MG tablet Take 20 mg by mouth 2 (two) times a day.     fluticasone-salmeterol (ADVAIR) 250-50 MCG/ACT AEPB Inhale 1 puff into the lungs in the morning and at bedtime.     ibuprofen (ADVIL) 800 MG tablet Take 800 mg by mouth daily as needed.     LamoTRIgine 300 MG TB24 24 hour tablet Take 300 mg by mouth 2 (two) times a day.     omeprazole (PRILOSEC) 20 MG capsule Take 20 mg by mouth daily.     phenytoin (DILANTIN) 300 MG ER capsule Take 1 capsule (300 mg total) by mouth 3 (three)  times daily. 270 capsule 3   propranolol (INDERAL) 10 MG tablet Take 10 mg by mouth 2 (two) times daily.     rizatriptan (MAXALT-MLT) 10 MG disintegrating tablet Take 10 mg by mouth as needed for migraine. May repeat in 2 hours if needed     simvastatin (ZOCOR) 20 MG tablet Take 20 mg by mouth at bedtime.      WIXELA INHUB 250-50 MCG/ACT AEPB 1 puff 2 (two) times daily.     No facility-administered medications prior to visit.    PAST MEDICAL HISTORY: Past Medical History:  Diagnosis Date   Anxiety    takes Xanax daily as needed   Arthritis, rheumatoid (HCC)    Chronic back pain    DDD   Degenerative disc disease    Degenerative disc disease    Dizziness    r/t seizures per pt   Enlarged prostate    takes Flomax daily   History of bronchitis many yrs ago   History of colon polyps    benign   History of migraine    last one a month ago-takes Maxalt if needed   Hyperlipidemia    takes Simvastatin nightly   Joint pain    Joint swelling    Muscle spasm    takes Flexeril daily as needed   Osteoarthritis    Pneumonia at age 4   "walking"   PTSD (post-traumatic stress disorder)    takes Lexapro daily   Seizures (HCC)    takes Dilantin and Lamictal daily;last seizure was 2 months ago   Severe depression (HCC)    takes Brintellix daily   Shortness of breath dyspnea    with exertion;has Spiriva and Albuterol if needed   Slow urinary stream     PAST SURGICAL HISTORY: Past Surgical History:  Procedure Laterality Date   COLONOSCOPY     INGUINAL HERNIA REPAIR  01/06/2012   Procedure: HERNIA REPAIR INGUINAL ADULT;  Surgeon: Dalia Heading, MD;  Location: [facility];  Service: General;  Laterality: Right;   MULTIPLE TOOTH EXTRACTIONS     TONSILLECTOMY     VAGUS NERVE STIMULATOR INSERTION N/A 07/26/2015   Procedure: VAGAL NERVE STIMULATOR PLACEMENT;  Surgeon: Lisbeth Renshaw, MD;  Location: [facility];  Service: Neurosurgery;  Laterality: N/A;  vagal nerve stimulator placement    VAGUS NERVE STIMULATOR INSERTION Left 2018   VAGUS NERVE STIMULATOR INSERTION N/A 07/25/2021   Procedure: VAGAL NERVE STIMULATOR BATTERY CHANGE;  Surgeon: Lisbeth Renshaw, MD;  Location: [facility];  Service: Neurosurgery;  Laterality: N/A;    FAMILY HISTORY: Family History  Adopted: Yes    SOCIAL HISTORY: Social History   Socioeconomic History   Marital status: Divorced    Spouse name: Not on file   Number of children: Not on file   Years of education: Not on file   Highest education level:  Not on file  Occupational History   Not on file  Tobacco Use   Smoking status: Every Day    Current packs/day: 0.50    Average packs/day: 0.5 packs/day for 33.0 years (16.5 ttl pk-yrs)    Types: Cigarettes   Smokeless tobacco: Former  Building services engineer status: Never Used  Substance and Sexual Activity   Alcohol use: No    Comment: recovering alcoholic for 12 years   Drug use: Yes    Types: Marijuana    Comment: last time was Easter Sunday   Sexual activity: Not Currently  Other Topics Concern   Not on file  Social History Narrative   Not on file   Social Drivers of Health   Financial Resource Strain: Not on file  Food Insecurity: Not on file  Transportation Needs: Not on file  Physical Activity: Not on file  Stress: Not on file  Social Connections: Not on file  Intimate Partner Violence: Not on file    PHYSICAL EXAM  GENERAL EXAM/CONSTITUTIONAL: Vitals:  There were no vitals filed for this visit.   There is no height or weight on file to calculate BMI. Wt Readings from Last 3 Encounters:  11/29/23 178 lb 1.6 oz (80.8 kg)  07/13/23 183 lb (83 kg)  05/03/23 182 lb 1.9 oz (82.6 kg)   Patient is in no distress; well developed, nourished and groomed; neck is supple  MUSCULOSKELETAL: Gait, strength, tone, movements noted in Neurologic exam below  NEUROLOGIC: MENTAL STATUS:      No data to display         awake, alert, oriented to person, place and  time recent and remote memory intact normal attention and concentration language fluent, comprehension intact, naming intact fund of knowledge appropriate  CRANIAL NERVE:  2nd, 3rd, 4th, 6th - Visual fields full to confrontation, extraocular muscles intact, no nystagmus 5th - facial sensation symmetric 7th - facial strength symmetric 8th - hearing intact 9th - palate elevates symmetrically, uvula midline 11th - shoulder shrug symmetric 12th - tongue protrusion midline  MOTOR:  normal bulk and tone, full strength in the BUE, BLE  SENSORY:  normal and symmetric to light touch  COORDINATION:  finger-nose-finger, fine finger movements normal  GAIT/STATION:  normal   DIAGNOSTIC DATA (LABS, IMAGING, TESTING) - I reviewed patient records, labs, notes, testing and imaging myself where available.  Lab Results  Component Value Date   WBC 8.4 11/29/2023   HGB 18.3 (H) 11/29/2023   HCT 53.9 (H) 11/29/2023   MCV 88.5 11/29/2023   PLT 221 11/29/2023      Component Value Date/Time   NA 135 11/29/2023 1610   NA 142 07/13/2023 0813   K 3.2 (L) 11/29/2023 1610   CL 96 (L) 11/29/2023 1610   CO2 25 11/29/2023 1610   GLUCOSE 104 (H) 11/29/2023 1610   BUN 18 11/29/2023 1610   BUN 10 07/13/2023 0813   CREATININE 0.92 11/29/2023 1610   CALCIUM 9.7 11/29/2023 1610   PROT 6.4 07/13/2023 0813   ALBUMIN 4.0 07/13/2023 0813   AST 14 07/13/2023 0813   ALT 18 07/13/2023 0813   ALKPHOS 188 (H) 07/13/2023 0813   BILITOT <0.2 07/13/2023 0813   GFRNONAA >60 11/29/2023 1610   GFRAA >60 07/17/2015 0948   No results found for: "CHOL", "HDL", "LDLCALC", "LDLDIRECT", "TRIG" No results found for: "HGBA1C" No results found for: "VITAMINB12" No results found for: "TSH"    ASSESSMENT AND PLAN  58 y.o. year old male  with history of epilepsy, intractable status post VNS therapy, chronic pain, anxiety who is presenting for follow up.  He continues to have 1-2 seizures despite being on 3  antiseizure medications and VNS.  However he does not swipe his VNS daily.  Plan will be to check his antiseizure medication level, his last phenytoin levels were low, if today it is low, we will likely increase the phenytoin to 450 mg twice daily.  We also discussed the importance of swiping his VNS, again advised patient to swipe his VNS at least twice a day.  Advised him to contact me if he has a breakthrough seizure otherwise I will see him in 6 months for follow-up or sooner if worse.

## 2024-03-02 DIAGNOSIS — Z1389 Encounter for screening for other disorder: Secondary | ICD-10-CM | POA: Diagnosis not present

## 2024-03-02 DIAGNOSIS — F17210 Nicotine dependence, cigarettes, uncomplicated: Secondary | ICD-10-CM | POA: Diagnosis not present

## 2024-03-02 DIAGNOSIS — K219 Gastro-esophageal reflux disease without esophagitis: Secondary | ICD-10-CM | POA: Diagnosis not present

## 2024-03-02 DIAGNOSIS — E785 Hyperlipidemia, unspecified: Secondary | ICD-10-CM | POA: Diagnosis not present

## 2024-03-02 DIAGNOSIS — R569 Unspecified convulsions: Secondary | ICD-10-CM | POA: Diagnosis not present

## 2024-03-02 DIAGNOSIS — F17200 Nicotine dependence, unspecified, uncomplicated: Secondary | ICD-10-CM | POA: Diagnosis not present

## 2024-03-02 DIAGNOSIS — Z0001 Encounter for general adult medical examination with abnormal findings: Secondary | ICD-10-CM | POA: Diagnosis not present

## 2024-03-02 DIAGNOSIS — J449 Chronic obstructive pulmonary disease, unspecified: Secondary | ICD-10-CM | POA: Diagnosis not present

## 2024-03-02 DIAGNOSIS — I10 Essential (primary) hypertension: Secondary | ICD-10-CM | POA: Diagnosis not present

## 2024-03-27 ENCOUNTER — Other Ambulatory Visit (HOSPITAL_COMMUNITY): Payer: Self-pay | Admitting: Gerontology

## 2024-03-27 DIAGNOSIS — Z87891 Personal history of nicotine dependence: Secondary | ICD-10-CM

## 2024-04-02 DIAGNOSIS — E785 Hyperlipidemia, unspecified: Secondary | ICD-10-CM | POA: Diagnosis not present

## 2024-04-02 DIAGNOSIS — J449 Chronic obstructive pulmonary disease, unspecified: Secondary | ICD-10-CM | POA: Diagnosis not present

## 2024-05-03 DIAGNOSIS — J449 Chronic obstructive pulmonary disease, unspecified: Secondary | ICD-10-CM | POA: Diagnosis not present

## 2024-05-03 DIAGNOSIS — E785 Hyperlipidemia, unspecified: Secondary | ICD-10-CM | POA: Diagnosis not present

## 2024-05-19 DIAGNOSIS — I10 Essential (primary) hypertension: Secondary | ICD-10-CM | POA: Diagnosis not present

## 2024-05-19 DIAGNOSIS — H9202 Otalgia, left ear: Secondary | ICD-10-CM | POA: Diagnosis not present

## 2024-05-19 DIAGNOSIS — H6692 Otitis media, unspecified, left ear: Secondary | ICD-10-CM | POA: Diagnosis not present

## 2024-06-19 DIAGNOSIS — E785 Hyperlipidemia, unspecified: Secondary | ICD-10-CM | POA: Diagnosis not present

## 2024-06-19 DIAGNOSIS — J449 Chronic obstructive pulmonary disease, unspecified: Secondary | ICD-10-CM | POA: Diagnosis not present

## 2024-07-03 DIAGNOSIS — I10 Essential (primary) hypertension: Secondary | ICD-10-CM | POA: Diagnosis not present

## 2024-07-03 DIAGNOSIS — J441 Chronic obstructive pulmonary disease with (acute) exacerbation: Secondary | ICD-10-CM | POA: Diagnosis not present

## 2024-07-03 DIAGNOSIS — R569 Unspecified convulsions: Secondary | ICD-10-CM | POA: Diagnosis not present

## 2024-08-03 DIAGNOSIS — E785 Hyperlipidemia, unspecified: Secondary | ICD-10-CM | POA: Diagnosis not present

## 2024-08-03 DIAGNOSIS — J449 Chronic obstructive pulmonary disease, unspecified: Secondary | ICD-10-CM | POA: Diagnosis not present

## 2024-09-08 DIAGNOSIS — F17200 Nicotine dependence, unspecified, uncomplicated: Secondary | ICD-10-CM | POA: Diagnosis not present

## 2024-09-08 DIAGNOSIS — I10 Essential (primary) hypertension: Secondary | ICD-10-CM | POA: Diagnosis not present

## 2024-09-08 DIAGNOSIS — J441 Chronic obstructive pulmonary disease with (acute) exacerbation: Secondary | ICD-10-CM | POA: Diagnosis not present

## 2024-09-08 DIAGNOSIS — R569 Unspecified convulsions: Secondary | ICD-10-CM | POA: Diagnosis not present

## 2024-09-08 DIAGNOSIS — J449 Chronic obstructive pulmonary disease, unspecified: Secondary | ICD-10-CM | POA: Diagnosis not present
